# Patient Record
Sex: Female | Born: 1985 | Race: Asian | Hispanic: No | Smoking: Former smoker
Health system: Southern US, Community
[De-identification: ages and names within clinical notes are randomized; demographics above are authoritative.]

## PROBLEM LIST (undated history)

## (undated) DIAGNOSIS — C8190 Hodgkin lymphoma, unspecified, unspecified site: Secondary | ICD-10-CM

## (undated) DIAGNOSIS — Z9221 Personal history of antineoplastic chemotherapy: Secondary | ICD-10-CM

## (undated) DIAGNOSIS — K759 Inflammatory liver disease, unspecified: Secondary | ICD-10-CM

## (undated) DIAGNOSIS — K219 Gastro-esophageal reflux disease without esophagitis: Secondary | ICD-10-CM

## (undated) DIAGNOSIS — N159 Renal tubulo-interstitial disease, unspecified: Secondary | ICD-10-CM

## (undated) DIAGNOSIS — M1990 Unspecified osteoarthritis, unspecified site: Secondary | ICD-10-CM

## (undated) DIAGNOSIS — J45909 Unspecified asthma, uncomplicated: Secondary | ICD-10-CM

## (undated) DIAGNOSIS — F32A Depression, unspecified: Secondary | ICD-10-CM

## (undated) HISTORY — PX: WISDOM TOOTH EXTRACTION: SHX21

## (undated) HISTORY — PX: CHOLECYSTECTOMY: SHX55

## (undated) HISTORY — PX: BREAST SURGERY: SHX581

## (undated) HISTORY — PX: KNEE SURGERY: SHX244

---

## 2010-06-21 ENCOUNTER — Emergency Department (HOSPITAL_COMMUNITY): Admission: EM | Admit: 2010-06-21 | Discharge: 2010-06-22 | Payer: Self-pay | Admitting: Emergency Medicine

## 2010-06-23 ENCOUNTER — Emergency Department (HOSPITAL_COMMUNITY): Admission: EM | Admit: 2010-06-23 | Discharge: 2010-06-24 | Payer: Self-pay | Admitting: Emergency Medicine

## 2010-06-28 ENCOUNTER — Inpatient Hospital Stay (HOSPITAL_COMMUNITY): Admission: EM | Admit: 2010-06-28 | Discharge: 2010-06-30 | Payer: Self-pay | Admitting: Emergency Medicine

## 2010-06-28 DIAGNOSIS — F848 Other pervasive developmental disorders: Secondary | ICD-10-CM

## 2010-07-07 ENCOUNTER — Ambulatory Visit: Payer: Self-pay | Admitting: Pulmonary Disease

## 2010-07-07 ENCOUNTER — Inpatient Hospital Stay (HOSPITAL_COMMUNITY)
Admission: EM | Admit: 2010-07-07 | Discharge: 2010-07-10 | Disposition: A | Discharge status: Other Acute Inpatient Hospital | Payer: Self-pay | Source: Home / Self Care | Admitting: Emergency Medicine

## 2010-07-08 DIAGNOSIS — F331 Major depressive disorder, recurrent, moderate: Secondary | ICD-10-CM

## 2010-07-10 ENCOUNTER — Inpatient Hospital Stay (HOSPITAL_COMMUNITY)
Admission: RE | Admit: 2010-07-10 | Discharge: 2010-07-15 | Payer: Self-pay | Source: Home / Self Care | Admitting: Psychiatry

## 2010-07-10 ENCOUNTER — Ambulatory Visit: Payer: Self-pay | Admitting: Psychiatry

## 2010-07-18 ENCOUNTER — Inpatient Hospital Stay (HOSPITAL_COMMUNITY)
Admission: RE | Admit: 2010-07-18 | Discharge: 2010-07-23 | Payer: Self-pay | Source: Home / Self Care | Attending: Psychiatry | Admitting: Psychiatry

## 2010-07-23 ENCOUNTER — Emergency Department (HOSPITAL_COMMUNITY)
Admission: EM | Admit: 2010-07-23 | Discharge: 2010-07-23 | Payer: Self-pay | Source: Home / Self Care | Admitting: Emergency Medicine

## 2010-07-24 ENCOUNTER — Inpatient Hospital Stay (HOSPITAL_COMMUNITY)
Admission: AD | Admit: 2010-07-24 | Discharge: 2010-08-04 | Payer: Self-pay | Source: Home / Self Care | Attending: Psychiatry | Admitting: Psychiatry

## 2010-07-24 ENCOUNTER — Inpatient Hospital Stay (HOSPITAL_COMMUNITY): Admission: RE | Admit: 2010-07-24 | Payer: Self-pay | Source: Home / Self Care | Admitting: Psychiatry

## 2010-07-24 DIAGNOSIS — F39 Unspecified mood [affective] disorder: Secondary | ICD-10-CM

## 2010-10-21 LAB — CBC
HCT: 34 % — ABNORMAL LOW (ref 36.0–46.0)
Hemoglobin: 11.4 g/dL — ABNORMAL LOW (ref 12.0–15.0)
Hemoglobin: 13.8 g/dL (ref 12.0–15.0)
MCH: 26.9 pg (ref 26.0–34.0)
MCH: 27 pg (ref 26.0–34.0)
MCH: 27.2 pg (ref 26.0–34.0)
MCHC: 32.3 g/dL (ref 30.0–36.0)
MCHC: 33.5 g/dL (ref 30.0–36.0)
MCHC: 34 g/dL (ref 30.0–36.0)
MCHC: 32.9 g/dL (ref 30.0–36.0)
MCV: 82.3 fL (ref 78.0–100.0)
MCV: 84.5 fL (ref 78.0–100.0)
MCV: 84.1 fL (ref 78.0–100.0)
MCV: 83.1 fL (ref 78.0–100.0)
Platelets: 274 10*3/uL (ref 150–400)
Platelets: 384 10*3/uL (ref 150–400)
Platelets: 358 10*3/uL (ref 150–400)
Platelets: 204 10*3/uL (ref 150–400)
RBC: 4.04 MIL/uL (ref 3.87–5.11)
RBC: 4.09 MIL/uL (ref 3.87–5.11)
RBC: 4.99 MIL/uL (ref 3.87–5.11)
RDW: 14.8 % (ref 11.5–15.5)
RDW: 15.5 % (ref 11.5–15.5)
RDW: 15.5 % (ref 11.5–15.5)
RDW: 15.9 % — ABNORMAL HIGH (ref 11.5–15.5)
WBC: 10.6 10*3/uL — ABNORMAL HIGH (ref 4.0–10.5)
WBC: 11.2 10*3/uL — ABNORMAL HIGH (ref 4.0–10.5)
WBC: 12.4 10*3/uL — ABNORMAL HIGH (ref 4.0–10.5)
WBC: 10.8 10*3/uL — ABNORMAL HIGH (ref 4.0–10.5)
WBC: 6.9 10*3/uL (ref 4.0–10.5)
WBC: 8.5 10*3/uL (ref 4.0–10.5)

## 2010-10-21 LAB — URINALYSIS, ROUTINE W REFLEX MICROSCOPIC
Bilirubin Urine: NEGATIVE
Bilirubin Urine: NEGATIVE
Bilirubin Urine: NEGATIVE
Glucose, UA: NEGATIVE mg/dL
Hgb urine dipstick: NEGATIVE
Hgb urine dipstick: NEGATIVE
Nitrite: NEGATIVE
Protein, ur: NEGATIVE mg/dL
Protein, ur: NEGATIVE mg/dL
Protein, ur: NEGATIVE mg/dL
Specific Gravity, Urine: 1.011 (ref 1.005–1.030)
Specific Gravity, Urine: 1.015 (ref 1.005–1.030)
Urobilinogen, UA: 0.2 mg/dL (ref 0.0–1.0)
Urobilinogen, UA: 0.2 mg/dL (ref 0.0–1.0)
Urobilinogen, UA: 1 mg/dL (ref 0.0–1.0)
Urobilinogen, UA: 0.2 mg/dL (ref 0.0–1.0)

## 2010-10-21 LAB — BASIC METABOLIC PANEL
BUN: 8 mg/dL (ref 6–23)
BUN: 5 mg/dL — ABNORMAL LOW (ref 6–23)
CO2: 26 mEq/L (ref 19–32)
Calcium: 8.5 mg/dL (ref 8.4–10.5)
Chloride: 108 mEq/L (ref 96–112)
Chloride: 109 mEq/L (ref 96–112)
Creatinine, Ser: 0.67 mg/dL (ref 0.4–1.2)
Creatinine, Ser: 0.75 mg/dL (ref 0.4–1.2)
GFR calc Af Amer: >60 mL/min (ref >60)
GFR calc non Af Amer: >60 mL/min (ref >60)
Glucose, Bld: 90 mg/dL (ref 70–99)
Sodium: 143 mEq/L (ref 135–145)

## 2010-10-21 LAB — CULTURE, BLOOD (ROUTINE X 2): Culture  Setup Time: 201111282128

## 2010-10-21 LAB — COMPREHENSIVE METABOLIC PANEL
ALT: 11 U/L (ref 0–35)
ALT: 8 U/L (ref 0–35)
AST: 14 U/L (ref 0–37)
AST: 13 U/L (ref 0–37)
Albumin: 3 g/dL — ABNORMAL LOW (ref 3.5–5.2)
Albumin: 3.8 g/dL (ref 3.5–5.2)
Alkaline Phosphatase: 62 U/L (ref 39–117)
BUN: 6 mg/dL (ref 6–23)
CO2: 25 mEq/L (ref 19–32)
CO2: 23 mEq/L (ref 19–32)
Calcium: 8.8 mg/dL (ref 8.4–10.5)
Calcium: 9.2 mg/dL (ref 8.4–10.5)
Creatinine, Ser: 0.59 mg/dL (ref 0.4–1.2)
GFR calc Af Amer: >60 mL/min (ref >60)
GFR calc Af Amer: >60 mL/min (ref >60)
GFR calc non Af Amer: >60 mL/min (ref >60)
GFR calc non Af Amer: >60 mL/min (ref >60)
Potassium: 2.7 mEq/L — CL (ref 3.5–5.1)
Sodium: 141 mEq/L (ref 135–145)
Sodium: 142 mEq/L (ref 135–145)
Total Protein: 7 g/dL (ref 6.0–8.3)

## 2010-10-21 LAB — BLOOD GAS, ARTERIAL
Acid-base deficit: 2.2 mmol/L — ABNORMAL HIGH (ref 0.0–2.0)
Bicarbonate: 21.8 mEq/L (ref 20.0–24.0)
Drawn by: 257701
Drawn by: 317871
MECHVT: 420 mL
MECHVT: 420 mL
O2 Saturation: 98.2 %
PEEP: 5 cmH2O
PEEP: 5 cmH2O
Patient temperature: 98.6
RATE: 15 resp/min
RATE: 15 resp/min
TCO2: 21.2 mmol/L (ref 0–100)
pCO2 arterial: 32.7 mmHg — ABNORMAL LOW (ref 35.0–45.0)
pCO2 arterial: 40.5 mmHg (ref 35.0–45.0)
pH, Arterial: 7.363 (ref 7.350–7.400)
pH, Arterial: 7.409 — ABNORMAL HIGH (ref 7.350–7.400)
pH, Arterial: 7.453 — ABNORMAL HIGH (ref 7.350–7.400)

## 2010-10-21 LAB — DIFFERENTIAL
Basophils Relative: 1 % (ref 0–1)
Eosinophils Absolute: 0.2 10*3/uL (ref 0.0–0.7)
Eosinophils Absolute: 0.2 10*3/uL (ref 0.0–0.7)
Eosinophils Relative: 3 % (ref 0–5)
Eosinophils Relative: 1 % (ref 0–5)
Lymphocytes Relative: 12 % (ref 12–46)
Lymphs Abs: 1.3 10*3/uL (ref 0.7–4.0)
Lymphs Abs: 1.1 10*3/uL (ref 0.7–4.0)
Lymphs Abs: 0.8 10*3/uL (ref 0.7–4.0)
Monocytes Absolute: 0.5 10*3/uL (ref 0.1–1.0)
Monocytes Absolute: 0.7 10*3/uL (ref 0.1–1.0)
Monocytes Relative: 5 % (ref 3–12)
Monocytes Relative: 5 % (ref 3–12)
Monocytes Relative: 4 % (ref 3–12)
Neutro Abs: 8.4 10*3/uL — ABNORMAL HIGH (ref 1.7–7.7)

## 2010-10-21 LAB — GLUCOSE, CAPILLARY
Glucose-Capillary: 118 mg/dL — ABNORMAL HIGH (ref 70–99)
Glucose-Capillary: 71 mg/dL (ref 70–99)
Glucose-Capillary: 74 mg/dL (ref 70–99)
Glucose-Capillary: 76 mg/dL (ref 70–99)
Glucose-Capillary: 80 mg/dL (ref 70–99)
Glucose-Capillary: 88 mg/dL (ref 70–99)
Glucose-Capillary: 107 mg/dL — ABNORMAL HIGH (ref 70–99)

## 2010-10-21 LAB — RAPID URINE DRUG SCREEN, HOSP PERFORMED
Amphetamines: NOT DETECTED
Amphetamines: NOT DETECTED
Barbiturates: NOT DETECTED
Benzodiazepines: POSITIVE — AB
Cocaine: NOT DETECTED
Opiates: NOT DETECTED
Opiates: NOT DETECTED
Opiates: NOT DETECTED
Tetrahydrocannabinol: POSITIVE — AB
Tetrahydrocannabinol: POSITIVE — AB

## 2010-10-21 LAB — ACETAMINOPHEN LEVEL
Acetaminophen (Tylenol), Serum: <10 ug/mL — ABNORMAL LOW (ref 10–30)
Acetaminophen (Tylenol), Serum: <10 ug/mL — ABNORMAL LOW (ref 10–30)
Acetaminophen (Tylenol), Serum: <10 ug/mL — ABNORMAL LOW (ref 10–30)
Acetaminophen (Tylenol), Serum: <10 ug/mL — ABNORMAL LOW (ref 10–30)

## 2010-10-21 LAB — POCT I-STAT 3, ART BLOOD GAS (G3+)
Acid-Base Excess: 1 mmol/L (ref 0.0–2.0)
Bicarbonate: 25 mEq/L — ABNORMAL HIGH (ref 20.0–24.0)
O2 Saturation: 100 %
Patient temperature: 98.3
TCO2: 26 mmol/L (ref 0–100)
pH, Arterial: 7.435 — ABNORMAL HIGH (ref 7.350–7.400)

## 2010-10-21 LAB — CARDIAC PANEL(CRET KIN+CKTOT+MB+TROPI)
Relative Index: INVALID (ref 0.0–2.5)
Total CK: 21 U/L (ref 7–177)
Total CK: 25 U/L (ref 7–177)

## 2010-10-21 LAB — MRSA PCR SCREENING: MRSA by PCR: NEGATIVE

## 2010-10-21 LAB — APTT: aPTT: 43 seconds — ABNORMAL HIGH (ref 24–37)

## 2010-10-21 LAB — POCT I-STAT, CHEM 8
BUN: 10 mg/dL (ref 6–23)
Calcium, Ion: 1.11 mmol/L — ABNORMAL LOW (ref 1.12–1.32)
Chloride: 106 mEq/L (ref 96–112)
Glucose, Bld: 90 mg/dL (ref 70–99)

## 2010-10-21 LAB — TSH: TSH: 0.604 u[IU]/mL (ref 0.350–4.500)

## 2010-10-21 LAB — URINE MICROSCOPIC-ADD ON

## 2010-10-21 LAB — ETHANOL
Alcohol, Ethyl (B): <5 mg/dL (ref 0–10)
Alcohol, Ethyl (B): <5 mg/dL (ref 0–10)

## 2010-10-21 LAB — PROTIME-INR: Prothrombin Time: 15 seconds (ref 11.6–15.2)

## 2010-10-21 LAB — POCT CARDIAC MARKERS: Troponin i, poc: <0.05 ng/mL (ref 0.00–0.09)

## 2010-10-21 LAB — POCT PREGNANCY, URINE: Preg Test, Ur: NEGATIVE

## 2010-10-21 LAB — LACTIC ACID, PLASMA: Lactic Acid, Venous: 1.1 mmol/L (ref 0.5–2.2)

## 2011-02-03 ENCOUNTER — Ambulatory Visit (HOSPITAL_COMMUNITY): Payer: Self-pay | Admitting: Psychiatry

## 2011-07-27 IMAGING — CR DG CHEST 2V
2 series · 2 of 2 positions shown · non-contrast
Comparison: 07/08/2010

CLINICAL DATA: Short of breath

CHEST - 2 VIEW

[w chest pa]
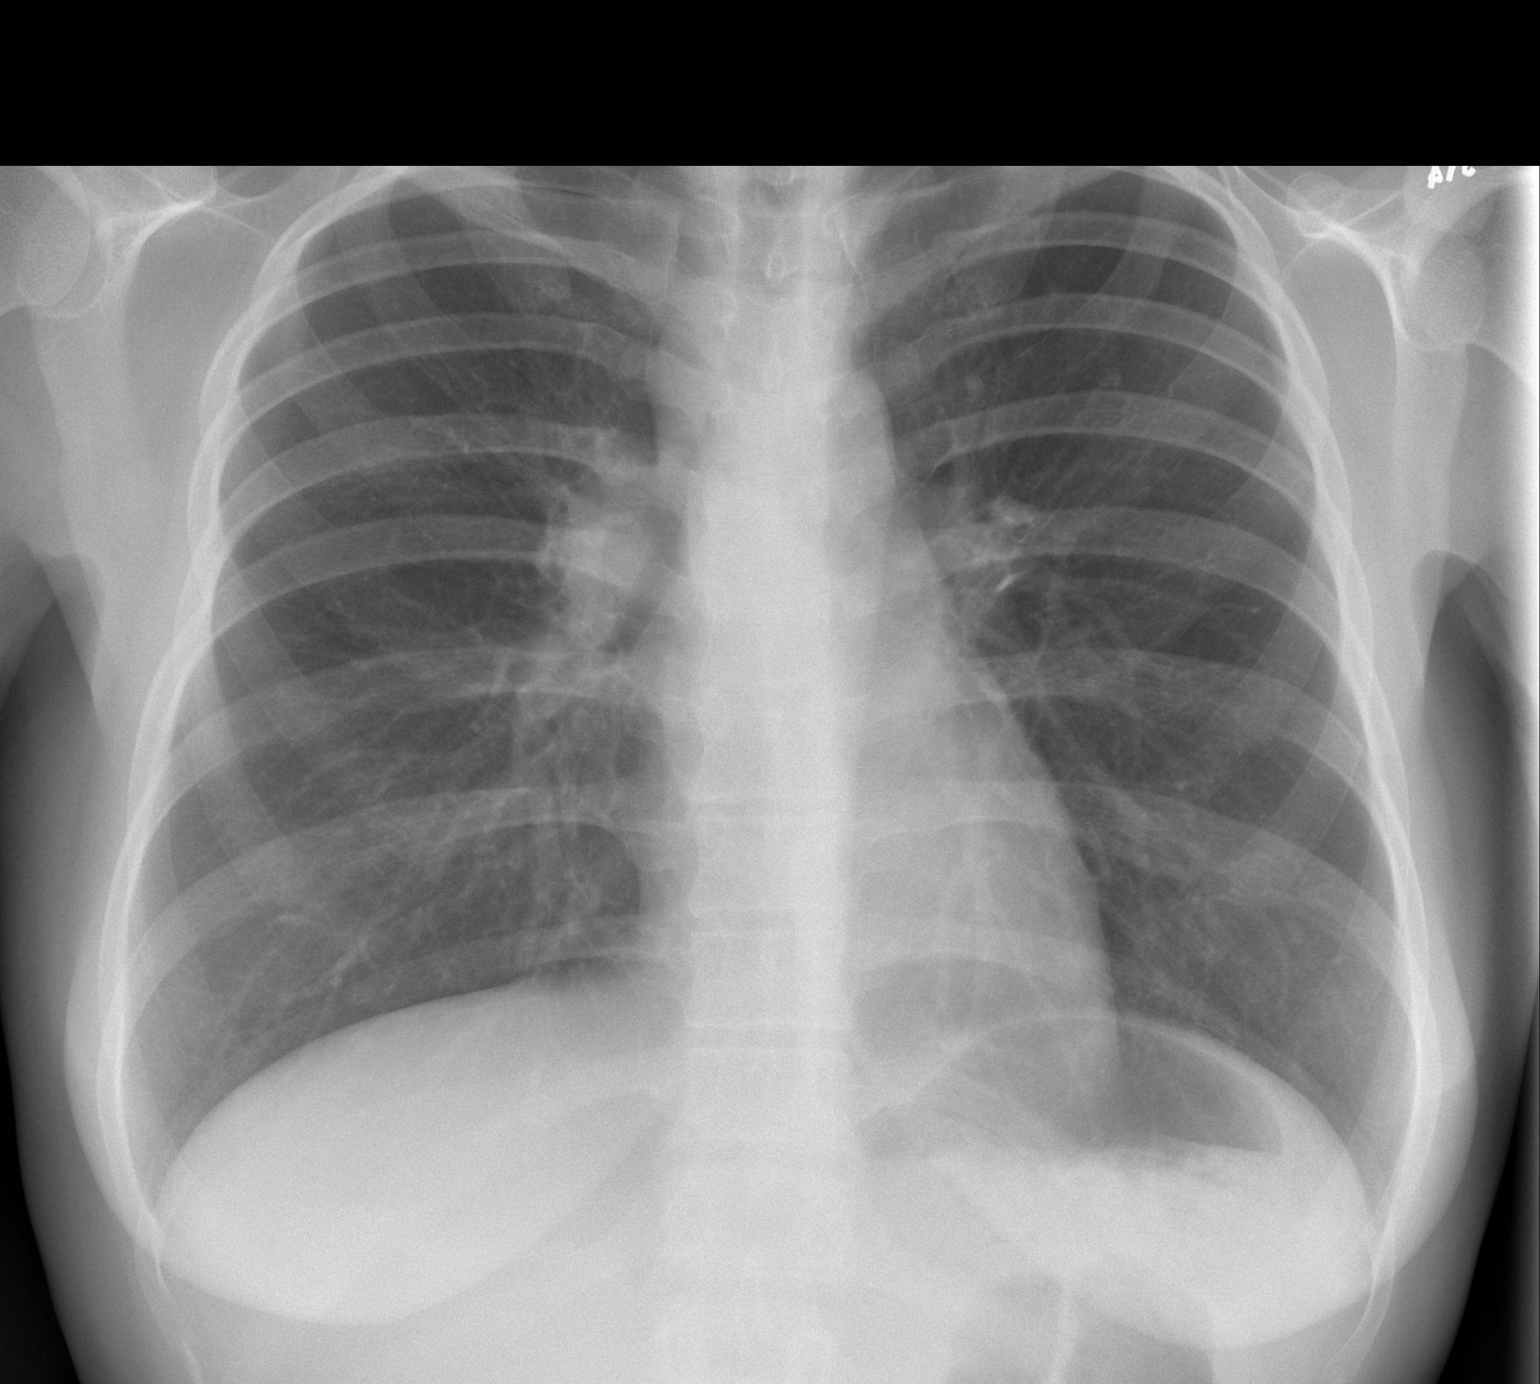

[w chest lat]
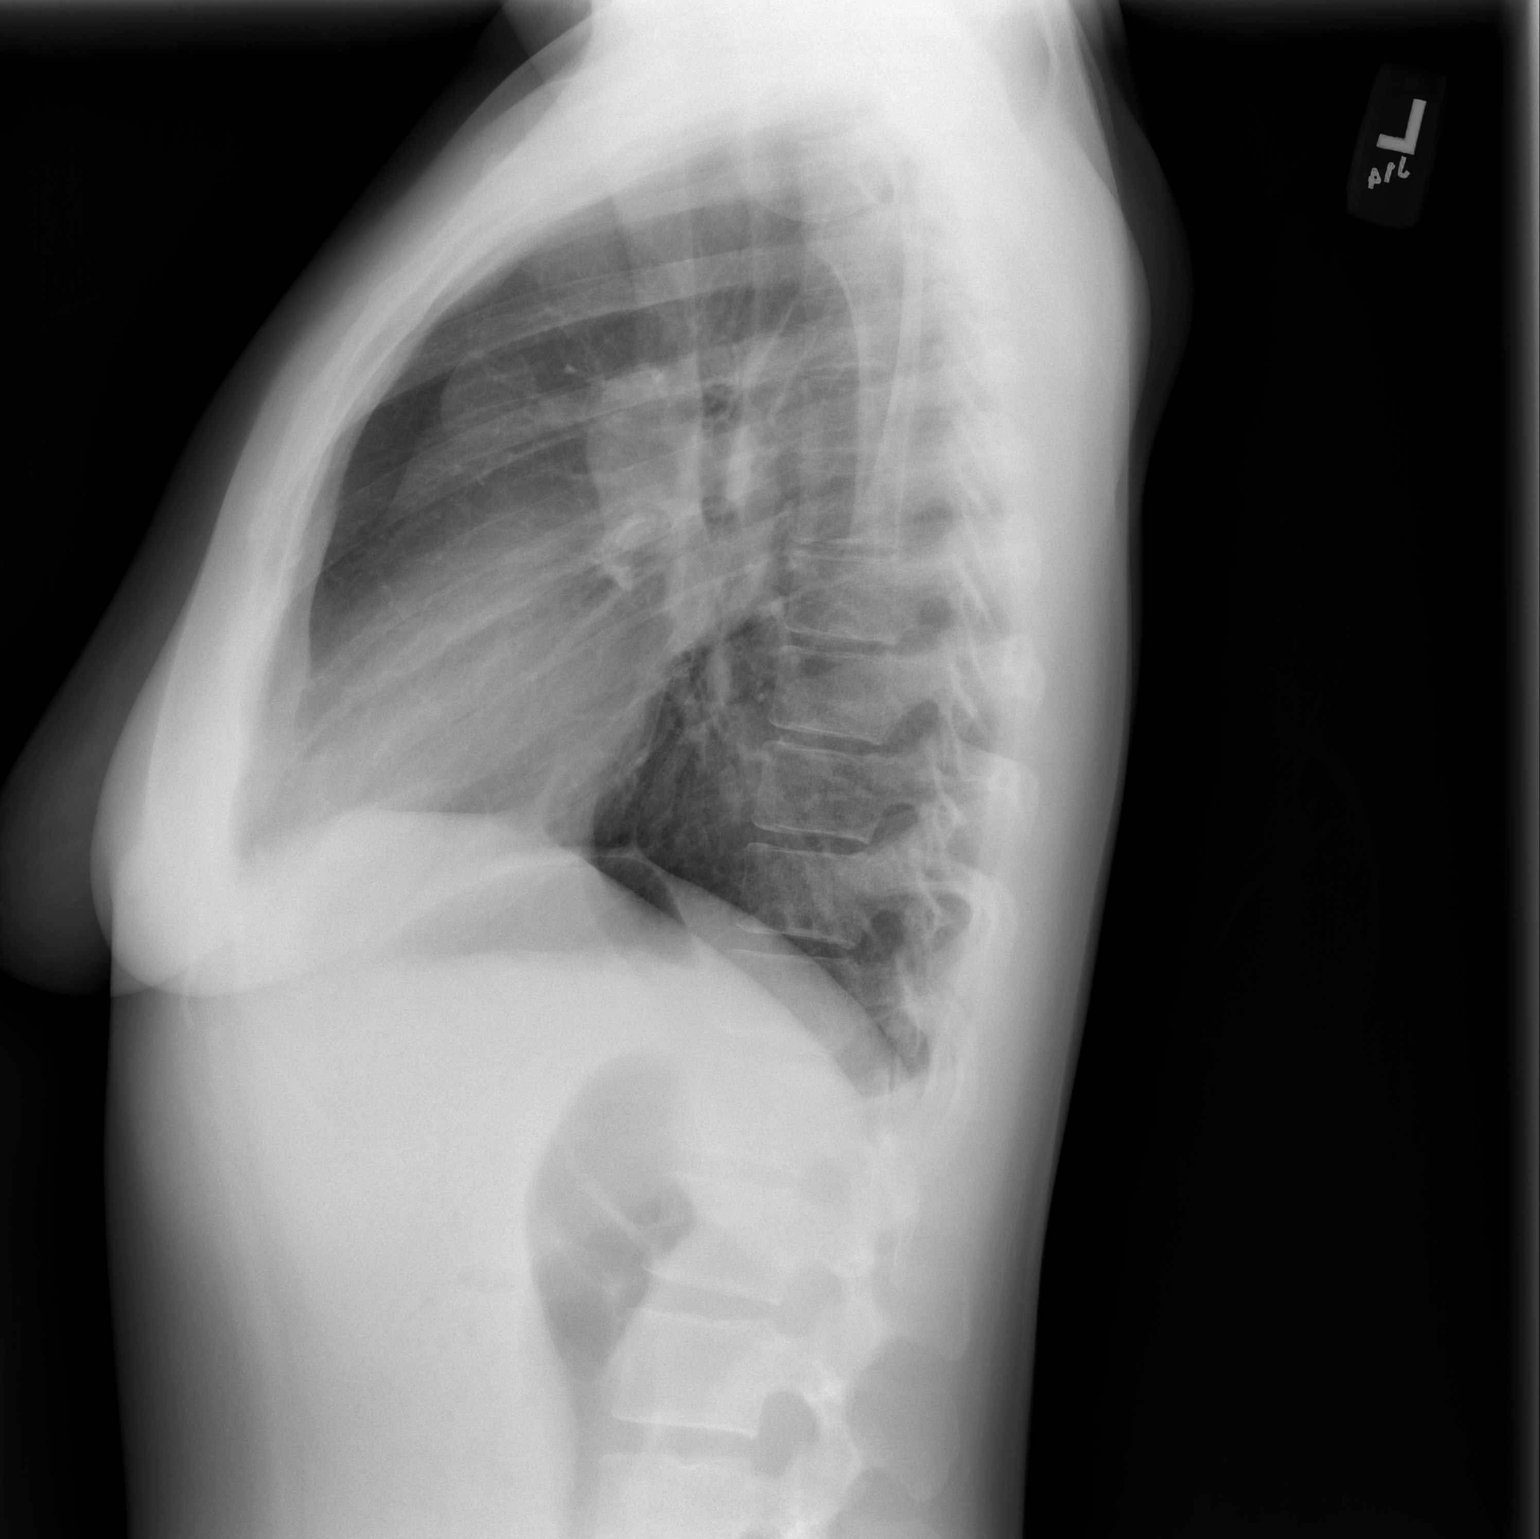

[2 of 2 positions shown; findings below may reference images not displayed]

FINDINGS: There is soft tissue prominence in the right paratracheal
and right hilar regions.  Bronchitic changes.  A 2 cm ovoid density
projects  over the retrosternal space.  No pneumothorax.  No
pleural effusion.  Normal heart size.
IMPRESSION: Findings worrisome for mass effect or adenopathy in the right hilum
and right paratracheal regions.  Comparison with prior films would
be helpful.  None are available, CT is warranted.

2 cm density projects over the retrosternal space.  Lung mass is
not excluded.

## 2016-11-02 DIAGNOSIS — Z8571 Personal history of Hodgkin lymphoma: Secondary | ICD-10-CM | POA: Diagnosis not present

## 2017-06-14 ENCOUNTER — Ambulatory Visit (HOSPITAL_COMMUNITY)
Admission: EM | Admit: 2017-06-14 | Discharge: 2017-06-14 | Disposition: A | Discharge status: Home / Self Care | Payer: Medicaid Other | Attending: Family Medicine | Admitting: Family Medicine

## 2017-06-14 ENCOUNTER — Encounter (HOSPITAL_COMMUNITY): Payer: Self-pay | Admitting: Emergency Medicine

## 2017-06-14 DIAGNOSIS — J0100 Acute maxillary sinusitis, unspecified: Secondary | ICD-10-CM

## 2017-06-14 DIAGNOSIS — K0889 Other specified disorders of teeth and supporting structures: Secondary | ICD-10-CM

## 2017-06-14 HISTORY — DX: Hodgkin lymphoma, unspecified, unspecified site: C81.90

## 2017-06-14 MED ORDER — AMOXICILLIN-POT CLAVULANATE 875-125 MG PO TABS: 1.0000 | ORAL_TABLET | Freq: Two times a day (BID) | ORAL | 0 refills | Status: DC

## 2017-06-14 MED ORDER — FLUTICASONE PROPIONATE 50 MCG/ACT NA SUSP: 2.0000 | Freq: Every day | NASAL | 0 refills | Status: DC

## 2017-06-14 MED ORDER — CETIRIZINE-PSEUDOEPHEDRINE ER 5-120 MG PO TB12
1.0000 | ORAL_TABLET | Freq: Every day | ORAL | 0 refills | Status: DC
Start: 2017-06-14 — End: 2020-07-12

## 2017-06-14 NOTE — Discharge Instructions (Signed)
Augmentin for sinus infection and possible dental abscess. Start flonase, zyrtec-D for nasal congestion. You can use over the counter nasal saline rinse such as neti pot for nasal congestion. Keep hydrated, your urine should be clear to pale yellow in color. Tylenol/motrin for fever and pain. Monitor for any worsening of symptoms, chest pain, shortness of breath, wheezing, swelling of the throat, follow up for reevaluation.   Please follow up with your dentist for further evaluation needed.

## 2017-06-14 NOTE — ED Provider Notes (Signed)
[facility]    CSN: [id number] Arrival date & time: 06/14/17  1013     History   Chief Complaint Chief Complaint  Patient presents with  . Nasal Congestion    HPI Salvador Valerie Johnson is a 31 y.o. female.   31 year old female comes in with 3 week history of nasal congestion. Productive cough first thing in the morning. Denies chest pain, shortness of breath, wheezing. Denies fevers, chills, night sweats. Denies sore throat. Has had some ear popping, denies pain, drainage. Noticed swollen lymph node on the left side of neck today. Current every day smoker, 1ppd, 16 pack year history. States she had a filling fixed 2 weeks ago, noticed some left lower tooth sensitivity this morning, and thinks there may be a filling that is missing. She has been taking ibuprofen and zyrtec without relief.        Past Medical History:  Diagnosis Date  . Hodgkin lymphoma ([place])     There are no active problems to display for this patient.   Past Surgical History:  Procedure Laterality Date  . CESAREAN SECTION    . KNEE SURGERY    . WISDOM TOOTH EXTRACTION      OB History    No data available       Home Medications    Prior to Admission medications   Medication Sig Start Date End Date Taking? Authorizing Provider  CETIRIZINE HCL PO Take by mouth.   Yes [provider]  omeprazole (PRILOSEC) 40 MG capsule Take 40 mg daily by mouth.   Yes [provider]  traZODone (DESYREL) 50 MG tablet Take 50 mg at bedtime by mouth.   Yes [provider]  amoxicillin-clavulanate (AUGMENTIN) 875-125 MG tablet Take 1 tablet every 12 (twelve) hours by mouth. 06/14/17   Tasia Catchings, Adrion Menz V, PA-C  cetirizine-pseudoephedrine (ZYRTEC-D) 5-120 MG tablet Take 1 tablet daily by mouth. 06/14/17   Tasia Catchings, Darina Hartwell V, PA-C  fluticasone (FLONASE) 50 MCG/ACT nasal spray Place 2 sprays daily into both nostrils. 06/14/17   Ok Edwards, PA-C    Family History No family history on file.  Social  History Social History   Tobacco Use  . Smoking status: Not on file  Substance Use Topics  . Alcohol use: Not on file  . Drug use: Not on file     Allergies   Patient has no known allergies.   Review of Systems Review of Systems  Reason unable to perform ROS: See HPI as above.     Physical Exam Triage Vital Signs ED Triage Vitals [06/14/17 1125]  Enc Vitals Group     BP 122/76     Pulse Rate 61     Resp 18     Temp 97.9 F (36.6 C)     Temp Source Oral     SpO2 100 %     Weight      Height      Head Circumference      Peak Flow      Pain Score 0     Pain Loc      Pain Edu?      Excl. in [place]?    No data found.  Updated Vital Signs BP 122/76   Pulse 61   Temp 97.9 F (36.6 C) (Oral)   Resp 18   SpO2 100%   Physical Exam  Constitutional: She is oriented to person, place, and time. She appears well-developed and well-nourished. No distress.  HENT:  Head: Normocephalic and atraumatic.  Right Ear: External ear and ear canal normal. Tympanic membrane is erythematous. Tympanic membrane is not bulging.  Left Ear: External ear and ear canal normal. Tympanic membrane is erythematous. Tympanic membrane is not bulging.  Nose: Right sinus exhibits maxillary sinus tenderness. Right sinus exhibits no frontal sinus tenderness. Left sinus exhibits maxillary sinus tenderness. Left sinus exhibits no frontal sinus tenderness.  Mouth/Throat: Uvula is midline, oropharynx is clear and moist and mucous membranes are normal.    Tenderness on palpation of left lower gum. No obvious swelling felt.   Eyes: Conjunctivae are normal. Pupils are equal, round, and reactive to light.  Neck: Normal range of motion. Neck supple.  Cardiovascular: Normal rate, regular rhythm and normal heart sounds. Exam reveals no gallop and no friction rub.  No murmur heard. Pulmonary/Chest: Effort normal and breath sounds normal. She has no decreased breath sounds. She has no wheezes. She has no rhonchi.  She has no rales.  Lymphadenopathy:    She has no cervical adenopathy.  Neurological: She is alert and oriented to person, place, and time.  Skin: Skin is warm and dry.  Psychiatric: She has a normal mood and affect. Her behavior is normal. Judgment normal.     UC Treatments / Results  Labs (all labs ordered are listed, but only abnormal results are displayed) Labs Reviewed - No data to display  EKG  EKG Interpretation None       Radiology No results found.  Procedures Procedures (including critical care time)  Medications Ordered in UC Medications - No data to display   Initial Impression / Assessment and Plan / UC Course  I have reviewed the triage vital signs and the nursing notes.  Pertinent labs & imaging results that were available during my care of the patient were reviewed by me and considered in my medical decision making (see chart for details).    Start augmentin as directed for sinusitis and possible dental abscess. Other symptomatic treatment provided. Patient to follow up with dentist for further evaluation and dental pain. Return precautions given.   Final Clinical Impressions(s) / UC Diagnoses   Final diagnoses:  Acute non-recurrent maxillary sinusitis  Pain, dental    New Prescriptions This SmartLink is deprecated. Use AVSMEDLIST instead to display the medication list for a patient.    Ok Edwards, PA-C 06/14/17 1302

## 2017-06-14 NOTE — ED Triage Notes (Signed)
Pt c/o swollen lymph node on L side of neck with nasal congestion.

## 2017-06-30 ENCOUNTER — Encounter (HOSPITAL_COMMUNITY): Payer: Self-pay | Admitting: Emergency Medicine

## 2017-06-30 ENCOUNTER — Ambulatory Visit (HOSPITAL_COMMUNITY)
Admission: EM | Admit: 2017-06-30 | Discharge: 2017-06-30 | Disposition: A | Discharge status: Home / Self Care | Payer: Medicaid Other | Attending: Family Medicine | Admitting: Family Medicine

## 2017-06-30 DIAGNOSIS — N3001 Acute cystitis with hematuria: Secondary | ICD-10-CM | POA: Diagnosis present

## 2017-06-30 DIAGNOSIS — Z8571 Personal history of Hodgkin lymphoma: Secondary | ICD-10-CM | POA: Diagnosis not present

## 2017-06-30 LAB — POCT URINALYSIS DIP (DEVICE)
GLUCOSE, UA: 250 mg/dL — AB
KETONES UR: 15 mg/dL — AB
Nitrite: POSITIVE — AB
PROTEIN: 100 mg/dL — AB
SPECIFIC GRAVITY, URINE: 1.015 (ref 1.005–1.030)
Urobilinogen, UA: 4 mg/dL — ABNORMAL HIGH (ref 0.0–1.0)
pH: 5.5 (ref 5.0–8.0)

## 2017-06-30 MED ORDER — SULFAMETHOXAZOLE-TRIMETHOPRIM 800-160 MG PO TABS: 1.0000 | ORAL_TABLET | Freq: Two times a day (BID) | ORAL | 0 refills | Status: AC

## 2017-06-30 NOTE — ED Provider Notes (Signed)
  [facility]    ASSESSMENT & PLAN:  1. Acute cystitis with hematuria     Meds ordered this encounter  Medications  . sulfamethoxazole-trimethoprim (BACTRIM DS,SEPTRA DS) 800-160 MG tablet    Sig: Take 1 tablet by mouth 2 (two) times daily for 5 days.    Dispense:  10 tablet    Refill:  0    Urine culture sent. Will notify patient when results available. Will follow up with her PCP or here if not showing improvement over the next 48 hours, sooner if needed.  Outlined signs and symptoms indicating need for more acute intervention. Patient verbalized understanding. After Visit Summary given.  SUBJECTIVE:  Valerie Johnson is a 31 y.o. female who complains of urinary frequency, urgency and dysuria for the past few days. No flank pain, fever, chills, abnormal vaginal discharge or bleeding. Hematuria: present.  Normal PO intake. No flank or abdominal pain. No self treatment.  LMP: No LMP recorded. Patient is not currently having periods (Reason: IUD).  ROS: As in HPI.  OBJECTIVE:  Vitals:   06/30/17 1130  BP: 126/82  Pulse: 73  Resp: 18  Temp: 98.6 F (37 C)  TempSrc: Oral  SpO2: 98%   Appears well, in no apparent distress. Abdomen is soft without tenderness, guarding, mass, rebound or organomegaly. No CVA tenderness or inguinal adenopathy noted.  Labs Reviewed  POCT URINALYSIS DIP (DEVICE) - Abnormal; Notable for the following components:      Result Value   Glucose, UA 250 (*)    Bilirubin Urine MODERATE (*)    Ketones, ur 15 (*)    Hgb urine dipstick TRACE (*)    Protein, ur 100 (*)    Urobilinogen, UA 4.0 (*)    Nitrite POSITIVE (*)    Leukocytes, UA LARGE (*)    All other components within normal limits  URINE CULTURE    No Known Allergies  Past Medical History:  Diagnosis Date  . Hodgkin lymphoma [facility])    Social History   Socioeconomic History  . Marital status: Legally Separated    Spouse name: Not on file  . Number of children: Not on  file  . Years of education: Not on file  . Highest education level: Not on file  Social Needs  . Financial resource strain: Not on file  . Food insecurity - worry: Not on file  . Food insecurity - inability: Not on file  . Transportation needs - medical: Not on file  . Transportation needs - non-medical: Not on file  Occupational History  . Not on file  Tobacco Use  . Smoking status: Not on file  Substance and Sexual Activity  . Alcohol use: Not on file  . Drug use: Not on file  . Sexual activity: Not on file  Other Topics Concern  . Not on file  Social History Narrative  . Not on file       Vanessa Kick, MD 06/30/17 1208

## 2017-06-30 NOTE — ED Triage Notes (Signed)
Pt here for UTI sx and is taking azo; pt sts nasal congestion and cough also

## 2017-07-02 LAB — URINE CULTURE

## 2019-02-08 DIAGNOSIS — C8118 Nodular sclerosis classical Hodgkin lymphoma, lymph nodes of multiple sites: Secondary | ICD-10-CM

## 2019-02-17 DIAGNOSIS — C8118 Nodular sclerosis classical Hodgkin lymphoma, lymph nodes of multiple sites: Secondary | ICD-10-CM

## 2020-05-23 ENCOUNTER — Other Ambulatory Visit (HOSPITAL_BASED_OUTPATIENT_CLINIC_OR_DEPARTMENT_OTHER): Payer: Self-pay | Admitting: Orthopedic Surgery

## 2020-05-23 DIAGNOSIS — M25561 Pain in right knee: Secondary | ICD-10-CM

## 2020-05-25 ENCOUNTER — Ambulatory Visit (HOSPITAL_BASED_OUTPATIENT_CLINIC_OR_DEPARTMENT_OTHER)
Admission: RE | Admit: 2020-05-25 | Discharge: 2020-05-25 | Disposition: A | Discharge status: Home / Self Care | Payer: BC Managed Care – PPO | Source: Ambulatory Visit | Attending: Orthopedic Surgery | Admitting: Orthopedic Surgery

## 2020-05-25 ENCOUNTER — Other Ambulatory Visit: Payer: Self-pay

## 2020-05-25 DIAGNOSIS — M25561 Pain in right knee: Secondary | ICD-10-CM | POA: Diagnosis not present

## 2020-06-17 NOTE — Patient Instructions (Addendum)
DUE TO COVID-19 ONLY ONE VISITOR IS ALLOWED TO COME WITH YOU AND STAY IN THE WAITING ROOM ONLY DURING PRE OP AND PROCEDURE DAY OF SURGERY. THE 1 VISITOR  MAY VISIT WITH YOU AFTER SURGERY IN YOUR PRIVATE ROOM DURING VISITING HOURS ONLY!  YOU NEED TO HAVE A COVID 19 TEST ON Thursday   06/20/2020 @_______ , THIS TEST MUST BE DONE BEFORE SURGERY,  COVID TESTING SITE [place] [address], IT IS ON THE RIGHT GOING OUT [address] APPROXIMATELY  2 MINUTES PAST [employer] ON THE RIGHT. ONCE YOUR COVID TEST IS COMPLETED,  PLEASE BEGIN THE QUARANTINE INSTRUCTIONS AS OUTLINED IN YOUR HANDOUT.                Valerie Johnson Finger  06/17/2020   Your procedure is scheduled on: Monday 06/24/2020   Report to [facility] Main  Entrance   Report to admitting at [place] AM     Call this number if you have problems the morning of surgery [phone]    Remember: Do not eat food After Midnight. May have clear liquids from midnight till 7:00 am day of surgery then nothing by mouth.   BRUSH YOUR TEETH MORNING OF SURGERY AND RINSE YOUR MOUTH OUT, NO CHEWING GUM CANDY OR MINTS.     Take these medicines the morning of surgery: Aripiprazole (Abilify); Cetirizine (Zyrtec); Omeprazole (Prilosec)                               You may not have any metal on your body including hair pins and              piercings  Do not wear jewelry, make-up, lotions, powders or perfumes, deodorant             Do not wear nail polish on your fingernails.  Do not shave  48 hours prior to surgery.     Do not bring valuables to the hospital. [place].  Contacts, dentures or bridgework may not be worn into surgery.      Patients discharged the day of surgery will not be allowed to drive home. IF YOU ARE HAVING SURGERY AND GOING HOME THE SAME DAY, YOU MUST HAVE AN ADULT TO DRIVE YOU HOME AND BE WITH YOU FOR 24 HOURS. YOU MAY GO HOME BY TAXI OR UBER  OR ORTHERWISE, BUT AN ADULT MUST ACCOMPANY YOU HOME AND STAY WITH YOU FOR 24 HOURS.  _____________________________________________________________________                CLEAR LIQUID DIET   Foods Allowed                                                                      Foods Excluded  Black Coffee and tea, regular and decaf                                            liquids that you  cannot  Plain Jell-O any favor except red or purple                                           see through such as: Fruit ices (not with fruit pulp)                                               milk, soups, orange juice  Iced Popsicles                                                      All solid food Carbonated beverages, regular and diet                                    Cranberry, grape and apple juices Sports drinks like Gatorade Lightly seasoned clear broth or consume(fat free) Sugar, honey syrup  _____________________________________________________________________  [facility] - Preparing for Surgery Before surgery, you can play an important role.  Because skin is not sterile, your skin needs to be as free of germs as possible.  You can reduce the number of germs on your skin by washing with CHG (chlorahexidine gluconate) soap before surgery.  CHG is an antiseptic cleaner which kills germs and bonds with the skin to continue killing germs even after washing. Please DO NOT use if you have an allergy to CHG or antibacterial soaps.  If your skin becomes reddened/irritated stop using the CHG and inform your nurse when you arrive at Short Stay. Do not shave (including legs and underarms) for at least 48 hours prior to the first CHG shower.  You may shave your face/neck. Please follow these instructions carefully:  1.  Shower with CHG Soap the night before surgery and the  morning of Surgery.  2.  If you choose to wash your hair, wash your hair first as usual with your  normal  shampoo.  3.  After  you shampoo, rinse your hair and body thoroughly to remove the  shampoo.                           4.  Use CHG as you would any other liquid soap.  You can apply chg directly  to the skin and wash                       Gently with a scrungie or clean washcloth.  5.  Apply the CHG Soap to your body ONLY FROM THE NECK DOWN.   Do not use on face/ open                           Wound or open sores. Avoid contact with eyes, ears mouth and genitals (private parts).                       Wash face,  Genitals (private parts) with your normal soap.  6.  Wash thoroughly, paying special attention to the area where your surgery  will be performed.  7.  Thoroughly rinse your body with warm water from the neck down.  8.  DO NOT shower/wash with your normal soap after using and rinsing off  the CHG Soap.                9.  Pat yourself dry with a clean towel.            10.  Wear clean pajamas.            11.  Place clean sheets on your bed the night of your first shower and do not  sleep with pets. Day of Surgery : Do not apply any lotions/deodorants the morning of surgery.  Please wear clean clothes to the hospital/surgery center.  FAILURE TO FOLLOW THESE INSTRUCTIONS MAY RESULT IN THE CANCELLATION OF YOUR SURGERY PATIENT SIGNATURE_________________________________  NURSE SIGNATURE__________________________________  ________________________________________________________________________

## 2020-06-17 NOTE — Progress Notes (Signed)
Please place surgical orders in epic for surgery procedure scheduled on 06/24/2020. Patient has PAT appt scheduled for 06/19/2020. Thanks.

## 2020-06-18 ENCOUNTER — Other Ambulatory Visit: Payer: Self-pay | Admitting: Orthopedic Surgery

## 2020-06-19 ENCOUNTER — Encounter (HOSPITAL_COMMUNITY): Payer: Self-pay

## 2020-06-19 ENCOUNTER — Encounter (HOSPITAL_COMMUNITY)
Admission: RE | Admit: 2020-06-19 | Discharge: 2020-06-19 | Disposition: A | Discharge status: Home / Self Care | Payer: BC Managed Care – PPO | Source: Ambulatory Visit | Attending: Orthopedic Surgery | Admitting: Orthopedic Surgery

## 2020-06-19 ENCOUNTER — Other Ambulatory Visit: Payer: Self-pay

## 2020-06-19 DIAGNOSIS — Z01812 Encounter for preprocedural laboratory examination: Secondary | ICD-10-CM | POA: Insufficient documentation

## 2020-06-19 HISTORY — DX: Depression, unspecified: F32.A

## 2020-06-19 HISTORY — DX: Renal tubulo-interstitial disease, unspecified: N15.9

## 2020-06-19 HISTORY — DX: Unspecified asthma, uncomplicated: J45.909

## 2020-06-19 HISTORY — DX: Unspecified osteoarthritis, unspecified site: M19.90

## 2020-06-19 HISTORY — DX: Inflammatory liver disease, unspecified: K75.9

## 2020-06-19 LAB — COMPREHENSIVE METABOLIC PANEL
ALT: 13 U/L (ref 0–44)
AST: 15 U/L (ref 15–41)
Albumin: 4.2 g/dL (ref 3.5–5.0)
Alkaline Phosphatase: 32 U/L — ABNORMAL LOW (ref 38–126)
Anion gap: 8 (ref 5–15)
BUN: 8 mg/dL (ref 6–20)
CO2: 24 mmol/L (ref 22–32)
Calcium: 9 mg/dL (ref 8.9–10.3)
Chloride: 106 mmol/L (ref 98–111)
Creatinine, Ser: 0.71 mg/dL (ref 0.44–1.00)
GFR, Estimated: >60 mL/min (ref >60)
Glucose, Bld: 84 mg/dL (ref 70–99)
Potassium: 4.6 mmol/L (ref 3.5–5.1)
Sodium: 138 mmol/L (ref 135–145)
Total Bilirubin: 0.8 mg/dL (ref 0.3–1.2)
Total Protein: 7.3 g/dL (ref 6.5–8.1)

## 2020-06-19 LAB — CBC WITH DIFFERENTIAL/PLATELET
Abs Immature Granulocytes: 0.01 10*3/uL (ref 0.00–0.07)
Basophils Absolute: 0.1 10*3/uL (ref 0.0–0.1)
Basophils Relative: 1 %
Eosinophils Absolute: 0.2 10*3/uL (ref 0.0–0.5)
Eosinophils Relative: 3 %
HCT: 38.3 % (ref 36.0–46.0)
Hemoglobin: 12.9 g/dL (ref 12.0–15.0)
Immature Granulocytes: 0 %
Lymphocytes Relative: 22 %
Lymphs Abs: 1.1 10*3/uL (ref 0.7–4.0)
MCH: 30.8 pg (ref 26.0–34.0)
MCHC: 33.7 g/dL (ref 30.0–36.0)
MCV: 91.4 fL (ref 80.0–100.0)
Monocytes Absolute: 0.3 10*3/uL (ref 0.1–1.0)
Monocytes Relative: 6 %
Neutro Abs: 3.5 10*3/uL (ref 1.7–7.7)
Neutrophils Relative %: 68 %
Platelets: 261 10*3/uL (ref 150–400)
RBC: 4.19 MIL/uL (ref 3.87–5.11)
RDW: 12.2 % (ref 11.5–15.5)
WBC: 5.3 10*3/uL (ref 4.0–10.5)
nRBC: 0 % (ref 0.0–0.2)

## 2020-06-19 NOTE — Progress Notes (Addendum)
PCP - Nodal, Reinaldo, PA-C Cardiologist - no  PPM/ICD -  Device Orders -  Rep Notified -   Chest x-ray -  EKG -  Stress Test -  ECHO -  Cardiac Cath -   Sleep Study -  CPAP -   Fasting Blood Sugar -  Checks Blood Sugar _____ times a day  Blood Thinner Instructions: Aspirin Instructions:  ERAS Protcol - PRE-SURGERY Ensure or G2-   COVID TEST- 11-11 ACTIVITY-  Can walk a flight of stairs without sob  Anesthesia review: Hx of Hep C went for treatment and it was no longer detected no treatment necessary, . Hodgkins lymphoma  Patient denies shortness of breath, fever, cough and chest pain at PAT appointment  none   All instructions explained to the patient, with a verbal understanding of the material. Patient agrees to go over the instructions while at home for a better understanding. Patient also instructed to self quarantine after being tested for COVID-19. The opportunity to ask questions was provided.

## 2020-06-20 ENCOUNTER — Other Ambulatory Visit (HOSPITAL_COMMUNITY)
Admission: RE | Admit: 2020-06-20 | Discharge: 2020-06-20 | Disposition: A | Discharge status: Home / Self Care | Payer: BC Managed Care – PPO | Source: Ambulatory Visit | Attending: Orthopedic Surgery | Admitting: Orthopedic Surgery

## 2020-06-20 DIAGNOSIS — Z20822 Contact with and (suspected) exposure to covid-19: Secondary | ICD-10-CM | POA: Diagnosis not present

## 2020-06-20 DIAGNOSIS — Z01812 Encounter for preprocedural laboratory examination: Secondary | ICD-10-CM | POA: Diagnosis not present

## 2020-06-20 LAB — SARS CORONAVIRUS 2 (TAT 6-24 HRS): SARS Coronavirus 2: NEGATIVE

## 2020-06-24 ENCOUNTER — Encounter (HOSPITAL_COMMUNITY): Admission: RE | Payer: Self-pay | Source: Home / Self Care

## 2020-06-24 ENCOUNTER — Ambulatory Visit (HOSPITAL_COMMUNITY): Admission: RE | Admit: 2020-06-24 | Payer: Medicaid Other | Source: Home / Self Care | Admitting: Orthopedic Surgery

## 2020-06-24 SURGERY — ARTHROSCOPY, KNEE, WITH PLICA EXCISION
Anesthesia: Choice | Site: Knee | Laterality: Right

## 2020-06-24 SURGERY — Surgical Case
Anesthesia: *Unknown

## 2020-07-12 ENCOUNTER — Encounter (HOSPITAL_COMMUNITY): Payer: Self-pay | Admitting: Orthopedic Surgery

## 2020-07-12 ENCOUNTER — Other Ambulatory Visit: Payer: Self-pay

## 2020-07-12 ENCOUNTER — Other Ambulatory Visit
Admission: RE | Admit: 2020-07-12 | Discharge: 2020-07-12 | Disposition: A | Discharge status: Home / Self Care | Payer: BC Managed Care – PPO | Source: Ambulatory Visit | Attending: Orthopedic Surgery | Admitting: Orthopedic Surgery

## 2020-07-12 ENCOUNTER — Other Ambulatory Visit: Payer: Self-pay | Admitting: Orthopedic Surgery

## 2020-07-12 DIAGNOSIS — Z20822 Contact with and (suspected) exposure to covid-19: Secondary | ICD-10-CM | POA: Diagnosis not present

## 2020-07-12 DIAGNOSIS — Z01812 Encounter for preprocedural laboratory examination: Secondary | ICD-10-CM | POA: Insufficient documentation

## 2020-07-12 NOTE — Progress Notes (Signed)
COVID Vaccine Completed: Yes Date COVID Vaccine completed: 10/06/19, 11/02/19,04/04/20 COVID vaccine manufacturer:  Moderna     PCP - Maggie Schwalbe, PA-C Cardiologist - N/A  Chest x-ray - N/A EKG - N/A Stress Test - N/A ECHO - N/A Cardiac Cath - N/A Pacemaker/ICD device last checked:N/A  Sleep Study - N/A CPAP - N/A  Fasting Blood Sugar - N/A Checks Blood Sugar __N/A___ times a day  Blood Thinner Instructions: N/A Aspirin Instructions: N/A Last Dose: N/A  Activity level:  Able to exercise without symptoms     Anesthesia review: N/A  Patient denies shortness of breath, fever, cough and chest pain at PAT appointment   Patient verbalized understanding of instructions that were given to them at the PAT appointment. Patient was also instructed that they will need to review over the PAT instructions again at home before surgery.

## 2020-07-12 NOTE — Progress Notes (Addendum)
Instructed patient to arrive at 0855 morning of surgery (12/6). Valerie Johnson verbalized understanding.

## 2020-07-14 LAB — SARS CORONAVIRUS 2 (TAT 6-24 HRS): SARS Coronavirus 2: NEGATIVE

## 2020-07-15 ENCOUNTER — Ambulatory Visit (HOSPITAL_COMMUNITY): Payer: BC Managed Care – PPO | Admitting: Certified Registered Nurse Anesthetist

## 2020-07-15 ENCOUNTER — Encounter (HOSPITAL_COMMUNITY)
Admission: RE | Disposition: A | Discharge status: Home / Self Care | Payer: Self-pay | Source: Home / Self Care | Attending: Orthopedic Surgery

## 2020-07-15 ENCOUNTER — Ambulatory Visit (HOSPITAL_COMMUNITY)
Admission: RE | Admit: 2020-07-15 | Discharge: 2020-07-15 | Disposition: A | Discharge status: Home / Self Care | Payer: BC Managed Care – PPO | Attending: Orthopedic Surgery | Admitting: Orthopedic Surgery

## 2020-07-15 ENCOUNTER — Encounter (HOSPITAL_COMMUNITY): Payer: Self-pay | Admitting: Orthopedic Surgery

## 2020-07-15 DIAGNOSIS — M6751 Plica syndrome, right knee: Secondary | ICD-10-CM | POA: Insufficient documentation

## 2020-07-15 DIAGNOSIS — Z87891 Personal history of nicotine dependence: Secondary | ICD-10-CM | POA: Diagnosis not present

## 2020-07-15 DIAGNOSIS — Z8571 Personal history of Hodgkin lymphoma: Secondary | ICD-10-CM | POA: Diagnosis not present

## 2020-07-15 DIAGNOSIS — M2241 Chondromalacia patellae, right knee: Secondary | ICD-10-CM | POA: Diagnosis not present

## 2020-07-15 DIAGNOSIS — Z79899 Other long term (current) drug therapy: Secondary | ICD-10-CM | POA: Insufficient documentation

## 2020-07-15 DIAGNOSIS — M795 Residual foreign body in soft tissue: Secondary | ICD-10-CM | POA: Insufficient documentation

## 2020-07-15 DIAGNOSIS — M94261 Chondromalacia, right knee: Secondary | ICD-10-CM | POA: Diagnosis not present

## 2020-07-15 DIAGNOSIS — Z793 Long term (current) use of hormonal contraceptives: Secondary | ICD-10-CM | POA: Insufficient documentation

## 2020-07-15 HISTORY — DX: Personal history of antineoplastic chemotherapy: Z92.21

## 2020-07-15 HISTORY — PX: MINOR HARDWARE REMOVAL: SHX6474

## 2020-07-15 HISTORY — DX: Gastro-esophageal reflux disease without esophagitis: K21.9

## 2020-07-15 HISTORY — PX: KNEE ARTHROSCOPY WITH EXCISION PLICA: SHX5647

## 2020-07-15 LAB — COMPREHENSIVE METABOLIC PANEL
ALT: 16 U/L (ref 0–44)
AST: 22 U/L (ref 15–41)
Albumin: 4.1 g/dL (ref 3.5–5.0)
Alkaline Phosphatase: 41 U/L (ref 38–126)
Anion gap: 9 (ref 5–15)
BUN: 10 mg/dL (ref 6–20)
CO2: 24 mmol/L (ref 22–32)
Calcium: 8.7 mg/dL — ABNORMAL LOW (ref 8.9–10.3)
Chloride: 101 mmol/L (ref 98–111)
Creatinine, Ser: 0.8 mg/dL (ref 0.44–1.00)
GFR, Estimated: >60 mL/min (ref >60)
Glucose, Bld: 86 mg/dL (ref 70–99)
Potassium: 4 mmol/L (ref 3.5–5.1)
Sodium: 134 mmol/L — ABNORMAL LOW (ref 135–145)
Total Bilirubin: 0.8 mg/dL (ref 0.3–1.2)
Total Protein: 7 g/dL (ref 6.5–8.1)

## 2020-07-15 LAB — CBC
HCT: 37.8 % (ref 36.0–46.0)
Hemoglobin: 12.5 g/dL (ref 12.0–15.0)
MCH: 29.8 pg (ref 26.0–34.0)
MCHC: 33.1 g/dL (ref 30.0–36.0)
MCV: 90.2 fL (ref 80.0–100.0)
Platelets: 253 10*3/uL (ref 150–400)
RBC: 4.19 MIL/uL (ref 3.87–5.11)
RDW: 12.2 % (ref 11.5–15.5)
WBC: 6.3 10*3/uL (ref 4.0–10.5)
nRBC: 0 % (ref 0.0–0.2)

## 2020-07-15 SURGERY — ARTHROSCOPY, KNEE, WITH PLICA EXCISION
Anesthesia: General | Site: Knee | Laterality: Right

## 2020-07-15 MED ORDER — LIDOCAINE 2% (20 MG/ML) 5 ML SYRINGE
INTRAMUSCULAR | Status: DC | PRN
  Administered 2020-07-15: 20 mg via INTRAVENOUS

## 2020-07-15 MED ORDER — FENTANYL CITRATE (PF) 100 MCG/2ML IJ SOLN: 25.0000 ug | INTRAMUSCULAR | Status: DC | PRN

## 2020-07-15 MED ORDER — ROPIVACAINE HCL 5 MG/ML IJ SOLN
INTRAMUSCULAR | Status: DC | PRN
  Administered 2020-07-15: 20 mL via PERINEURAL

## 2020-07-15 MED ORDER — PROPOFOL 1000 MG/100ML IV EMUL
INTRAVENOUS | Status: AC
  Filled 2020-07-15: qty 100

## 2020-07-15 MED ORDER — OXYCODONE HCL 5 MG PO TABS: 5.0000 mg | ORAL_TABLET | Freq: Once | ORAL | Status: AC

## 2020-07-15 MED ORDER — DEXAMETHASONE SODIUM PHOSPHATE 10 MG/ML IJ SOLN
INTRAMUSCULAR | Status: AC
  Filled 2020-07-15: qty 1

## 2020-07-15 MED ORDER — LIDOCAINE HCL (PF) 2 % IJ SOLN
INTRAMUSCULAR | Status: AC
  Filled 2020-07-15: qty 5

## 2020-07-15 MED ORDER — KETOROLAC TROMETHAMINE 30 MG/ML IJ SOLN
INTRAMUSCULAR | Status: DC | PRN
  Administered 2020-07-15: 30 mg via INTRAVENOUS

## 2020-07-15 MED ORDER — ACETAMINOPHEN 10 MG/ML IV SOLN
INTRAVENOUS | Status: DC | PRN
  Administered 2020-07-15: 1000 mg via INTRAVENOUS

## 2020-07-15 MED ORDER — FENTANYL CITRATE (PF) 100 MCG/2ML IJ SOLN
INTRAMUSCULAR | Status: AC
  Administered 2020-07-15: 50 ug via INTRAVENOUS
  Filled 2020-07-15: qty 2

## 2020-07-15 MED ORDER — OXYCODONE HCL 5 MG PO TABS
5.0000 mg | ORAL_TABLET | Freq: Four times a day (QID) | ORAL | 0 refills | Status: DC | PRN
Start: 2020-07-15 — End: 2020-07-19

## 2020-07-15 MED ORDER — FENTANYL CITRATE (PF) 100 MCG/2ML IJ SOLN
INTRAMUSCULAR | Status: DC | PRN
  Administered 2020-07-15 (×2): 25 ug via INTRAVENOUS
  Administered 2020-07-15: 50 ug via INTRAVENOUS

## 2020-07-15 MED ORDER — ACETAMINOPHEN 500 MG PO TABS: 1000.0000 mg | ORAL_TABLET | Freq: Once | ORAL | Status: DC

## 2020-07-15 MED ORDER — DEXAMETHASONE SODIUM PHOSPHATE 10 MG/ML IJ SOLN
INTRAMUSCULAR | Status: DC | PRN
  Administered 2020-07-15: 5 mg via INTRAVENOUS

## 2020-07-15 MED ORDER — SODIUM CHLORIDE 0.9 % IR SOLN
Status: DC | PRN
  Administered 2020-07-15: 6000 mL

## 2020-07-15 MED ORDER — MIDAZOLAM HCL 2 MG/2ML IJ SOLN: 1.0000 mg | INTRAMUSCULAR | Status: DC

## 2020-07-15 MED ORDER — PROPOFOL 10 MG/ML IV BOLUS
INTRAVENOUS | Status: AC
  Filled 2020-07-15: qty 20

## 2020-07-15 MED ORDER — ORAL CARE MOUTH RINSE: 15.0000 mL | Freq: Once | OROMUCOSAL | Status: AC

## 2020-07-15 MED ORDER — MIDAZOLAM HCL 2 MG/2ML IJ SOLN
INTRAMUSCULAR | Status: AC
  Administered 2020-07-15: 2 mg via INTRAVENOUS
  Filled 2020-07-15: qty 2

## 2020-07-15 MED ORDER — BUPIVACAINE-EPINEPHRINE 0.5% -1:200000 IJ SOLN
INTRAMUSCULAR | Status: AC
  Filled 2020-07-15: qty 1

## 2020-07-15 MED ORDER — AMISULPRIDE (ANTIEMETIC) 5 MG/2ML IV SOLN: 10.0000 mg | Freq: Once | INTRAVENOUS | Status: DC | PRN

## 2020-07-15 MED ORDER — OXYCODONE HCL 5 MG PO TABS
ORAL_TABLET | ORAL | Status: AC
  Administered 2020-07-15: 5 mg
  Filled 2020-07-15: qty 1

## 2020-07-15 MED ORDER — CLONIDINE HCL (ANALGESIA) 100 MCG/ML EP SOLN
EPIDURAL | Status: DC | PRN
  Administered 2020-07-15: 50 ug

## 2020-07-15 MED ORDER — OXYCODONE HCL 5 MG PO TABS
ORAL_TABLET | ORAL | Status: AC
  Administered 2020-07-15: 5 mg via ORAL
  Filled 2020-07-15: qty 1

## 2020-07-15 MED ORDER — IBUPROFEN 800 MG PO TABS: 800.0000 mg | ORAL_TABLET | Freq: Three times a day (TID) | ORAL | 0 refills | Status: DC | PRN

## 2020-07-15 MED ORDER — EPHEDRINE SULFATE-NACL 50-0.9 MG/10ML-% IV SOSY
PREFILLED_SYRINGE | INTRAVENOUS | Status: DC | PRN
  Administered 2020-07-15 (×3): 10 mg via INTRAVENOUS

## 2020-07-15 MED ORDER — CELECOXIB 200 MG PO CAPS: 200.0000 mg | ORAL_CAPSULE | Freq: Once | ORAL | Status: DC

## 2020-07-15 MED ORDER — MIDAZOLAM HCL 2 MG/2ML IJ SOLN
INTRAMUSCULAR | Status: AC
  Filled 2020-07-15: qty 2

## 2020-07-15 MED ORDER — LACTATED RINGERS IV SOLN: INTRAVENOUS | Status: DC

## 2020-07-15 MED ORDER — ONDANSETRON HCL 4 MG/2ML IJ SOLN
INTRAMUSCULAR | Status: AC
  Filled 2020-07-15: qty 2

## 2020-07-15 MED ORDER — CHLORHEXIDINE GLUCONATE 0.12 % MT SOLN
15.0000 mL | Freq: Once | OROMUCOSAL | Status: AC
  Administered 2020-07-15: 15 mL via OROMUCOSAL

## 2020-07-15 MED ORDER — MIDAZOLAM HCL 5 MG/5ML IJ SOLN
INTRAMUSCULAR | Status: DC | PRN
  Administered 2020-07-15: 2 mg via INTRAVENOUS

## 2020-07-15 MED ORDER — ACETAMINOPHEN 10 MG/ML IV SOLN
INTRAVENOUS | Status: AC
  Filled 2020-07-15: qty 100

## 2020-07-15 MED ORDER — FENTANYL CITRATE (PF) 100 MCG/2ML IJ SOLN
INTRAMUSCULAR | Status: AC
  Filled 2020-07-15: qty 2

## 2020-07-15 MED ORDER — ONDANSETRON HCL 4 MG/2ML IJ SOLN
INTRAMUSCULAR | Status: DC | PRN
  Administered 2020-07-15: 4 mg via INTRAVENOUS

## 2020-07-15 MED ORDER — PROPOFOL 10 MG/ML IV BOLUS
INTRAVENOUS | Status: DC | PRN
  Administered 2020-07-15: 200 mg via INTRAVENOUS

## 2020-07-15 MED ORDER — FENTANYL CITRATE (PF) 100 MCG/2ML IJ SOLN: 50.0000 ug | INTRAMUSCULAR | Status: DC

## 2020-07-15 SURGICAL SUPPLY — 36 items
BLADE SURG 15 STRL LF DISP TIS (BLADE) ×1 IMPLANT
BLADE SURG 15 STRL SS (BLADE) ×1
BNDG ELASTIC 6X5.8 VLCR STR LF (GAUZE/BANDAGES/DRESSINGS) ×2 IMPLANT
COVER SURGICAL LIGHT HANDLE (MISCELLANEOUS) ×2 IMPLANT
COVER WAND RF STERILE (DRAPES) IMPLANT
DISSECTOR 4.0MM X 13CM (MISCELLANEOUS) ×2 IMPLANT
DRAPE U-SHAPE 47X51 STRL (DRAPES) ×2 IMPLANT
DRSG PAD ABDOMINAL 8X10 ST (GAUZE/BANDAGES/DRESSINGS) ×2 IMPLANT
DURAPREP 26ML APPLICATOR (WOUND CARE) ×2 IMPLANT
GAUZE SPONGE 4X4 12PLY STRL (GAUZE/BANDAGES/DRESSINGS) ×2 IMPLANT
GAUZE XEROFORM 1X8 LF (GAUZE/BANDAGES/DRESSINGS) ×2 IMPLANT
GLOVE BIOGEL M STRL SZ7.5 (GLOVE) ×2 IMPLANT
GLOVE BIOGEL PI IND STRL 7.5 (GLOVE) ×1 IMPLANT
GLOVE BIOGEL PI IND STRL 8.5 (GLOVE) ×2 IMPLANT
GLOVE BIOGEL PI INDICATOR 7.5 (GLOVE) ×1
GLOVE BIOGEL PI INDICATOR 8.5 (GLOVE) ×2
GLOVE ORTHO TXT STRL SZ7.5 (GLOVE) ×2 IMPLANT
GLOVE SURG ORTHO 8.0 STRL STRW (GLOVE) ×4 IMPLANT
GOWN STRL REUS W/ TWL XL LVL3 (GOWN DISPOSABLE) ×2 IMPLANT
GOWN STRL REUS W/TWL XL LVL3 (GOWN DISPOSABLE) ×2
IV LACTATED RINGER IRRG 3000ML (IV SOLUTION) ×4
IV LR IRRIG 3000ML ARTHROMATIC (IV SOLUTION) ×4 IMPLANT
KIT BASIN OR (CUSTOM PROCEDURE TRAY) ×2 IMPLANT
MANIFOLD NEPTUNE II (INSTRUMENTS) ×4 IMPLANT
PACK ARTHROSCOPY WL (CUSTOM PROCEDURE TRAY) ×2 IMPLANT
PADDING CAST COTTON 6X4 STRL (CAST SUPPLIES) ×2 IMPLANT
PENCIL SMOKE EVACUATOR (MISCELLANEOUS) IMPLANT
PROBE BIPOLAR ATHRO 135MM 90D (MISCELLANEOUS) IMPLANT
PROTECTOR NERVE ULNAR (MISCELLANEOUS) ×2 IMPLANT
SUCTION FRAZIER HANDLE 12FR (TUBING) ×1
SUCTION TUBE FRAZIER 12FR DISP (TUBING) ×1 IMPLANT
SUT ETHILON 3 0 FSL (SUTURE) ×2 IMPLANT
SUT ETHILON 4 0 PS 2 18 (SUTURE) ×2 IMPLANT
TOWEL OR 17X26 10 PK STRL BLUE (TOWEL DISPOSABLE) ×2 IMPLANT
TUBING ARTHROSCOPY IRRIG 16FT (MISCELLANEOUS) ×2 IMPLANT
TUBING CONNECTING 10 (TUBING) ×2 IMPLANT

## 2020-07-15 NOTE — Anesthesia Procedure Notes (Signed)
Procedure Name: LMA Insertion Performed by: Rosaland Lao, CRNA Pre-anesthesia Checklist: Patient identified, Emergency Drugs available, Suction available and Patient being monitored Patient Re-evaluated:Patient Re-evaluated prior to induction Oxygen Delivery Method: Circle system utilized Preoxygenation: Pre-oxygenation with 100% oxygen Induction Type: IV induction LMA: LMA inserted LMA Size: 4.0 Number of attempts: 1 Dental Injury: Teeth and Oropharynx as per pre-operative assessment

## 2020-07-15 NOTE — Discharge Instructions (Signed)
Last Tylenol 1000mg  IV at 12:53 pm Last Oxycodone IR 5mg  at 1:31 pm Take Pain medication with food when Pain is 3 or greater on 3/10 scale and only as ordered.  Also use Ice for pain.  You may not drive while taking perscription pain medications as directed.   General Anesthesia, Adult, Care After This sheet gives you information about how to care for yourself after your procedure. Your health care provider may also give you more specific instructions. If you have problems or questions, contact your health care provider. What can I expect after the procedure? After the procedure, the following side effects are common:  Pain or discomfort at the IV site.  Nausea.  Vomiting.  Sore throat.  Trouble concentrating.  Feeling cold or chills.  Weak or tired.  Sleepiness and fatigue.  Soreness and body aches. These side effects can affect parts of the body that were not involved in surgery. Follow these instructions at home:  For at least 24 hours after the procedure:  Have a responsible adult stay with you. It is important to have someone help care for you until you are awake and alert.  Rest as needed.  Do not: ? Participate in activities in which you could fall or become injured. ? Drive. ? Use heavy machinery. ? Drink alcohol. ? Take sleeping pills or medicines that cause drowsiness. ? Make important decisions or sign legal documents. ? Take care of children on your own. Eating and drinking  Follow any instructions from your health care provider about eating or drinking restrictions.  When you feel hungry, start by eating small amounts of foods that are soft and easy to digest (bland), such as toast. Gradually return to your regular diet.  Drink enough fluid to keep your urine pale yellow.  If you vomit, rehydrate by drinking water, juice, or clear broth. General instructions  If you have sleep apnea, surgery and certain medicines can increase your risk for breathing  problems. Follow instructions from your health care provider about wearing your sleep device: ? Anytime you are sleeping, including during daytime naps. ? While taking prescription pain medicines, sleeping medicines, or medicines that make you drowsy.  Return to your normal activities as told by your health care provider. Ask your health care provider what activities are safe for you.  Take over-the-counter and prescription medicines only as told by your health care provider.  If you smoke, do not smoke without supervision.  Keep all follow-up visits as told by your health care provider. This is important. Contact a health care provider if:  You have nausea or vomiting that does not get better with medicine.  You cannot eat or drink without vomiting.  You have pain that does not get better with medicine.  You are unable to pass urine.  You develop a skin rash.  You have a fever.  You have redness around your IV site that gets worse. Get help right away if:  You have difficulty breathing.  You have chest pain.  You have blood in your urine or stool, or you vomit blood. Summary  After the procedure, it is common to have a sore throat or nausea. It is also common to feel tired.  Have a responsible adult stay with you for the first 24 hours after general anesthesia. It is important to have someone help care for you until you are awake and alert.  When you feel hungry, start by eating small amounts of foods that are soft  and easy to digest (bland), such as toast. Gradually return to your regular diet.  Drink enough fluid to keep your urine pale yellow.  Return to your normal activities as told by your health care provider. Ask your health care provider what activities are safe for you. This information is not intended to replace advice given to you by your health care provider. Make sure you discuss any questions you have with your health care provider. Document Revised:  07/30/2017 Document Reviewed: 03/12/2017 Elsevier Patient Education  [place].

## 2020-07-15 NOTE — Anesthesia Preprocedure Evaluation (Addendum)
Anesthesia Evaluation  Patient identified by MRN, date of birth, ID band Patient awake    Reviewed: Allergy & Precautions, NPO status , Patient's Chart, lab work & pertinent test results  Airway Mallampati: II  TM Distance: >3 FB Neck ROM: Full    Dental  (+) Dental Advisory Given   Pulmonary asthma , former smoker,    breath sounds clear to auscultation       Cardiovascular negative cardio ROS   Rhythm:Regular Rate:Normal     Neuro/Psych negative neurological ROS     GI/Hepatic GERD  ,(+) Hepatitis -, C  Endo/Other  negative endocrine ROS  Renal/GU negative Renal ROS     Musculoskeletal   Abdominal   Peds  Hematology negative hematology ROS (+)   Anesthesia Other Findings   Reproductive/Obstetrics                             Lab Results  Component Value Date   WBC 6.3 07/15/2020   HGB 12.5 07/15/2020   HCT 37.8 07/15/2020   MCV 90.2 07/15/2020   PLT 253 07/15/2020   Lab Results  Component Value Date   CREATININE 0.80 07/15/2020   BUN 10 07/15/2020   NA 134 (L) 07/15/2020   K 4.0 07/15/2020   CL 101 07/15/2020   CO2 24 07/15/2020    Anesthesia Physical Anesthesia Plan  ASA: II  Anesthesia Plan: General   Post-op Pain Management:  Regional for Post-op pain   Induction: Intravenous  PONV Risk Score and Plan: 3 and Dexamethasone, Ondansetron, Midazolam and Treatment may vary due to age or medical condition  Airway Management Planned: LMA  Additional Equipment:   Intra-op Plan:   Post-operative Plan: Extubation in OR  Informed Consent: I have reviewed the patients History and Physical, chart, labs and discussed the procedure including the risks, benefits and alternatives for the proposed anesthesia with the patient or authorized representative who has indicated his/her understanding and acceptance.     Dental advisory given  Plan Discussed with:  CRNA  Anesthesia Plan Comments:        Anesthesia Quick Evaluation

## 2020-07-15 NOTE — H&P (Signed)
Valerie Johnson MRN:  [id number] DOB/SEX:  1985-12-26/female  CHIEF COMPLAINT:  Painful right Knee  HISTORY: Patient is a 34 y.o. female presented with a history of pain in the right knee. Onset of symptoms was gradual starting a few months ago with gradually worsening course since that time. Patient has been treated conservatively with over-the-counter NSAIDs and activity modification. Patient currently rates pain in the knee at 10 out of 10 with activity. There is pain at night.  PAST MEDICAL HISTORY: There are no problems to display for this patient.  Past Medical History:  Diagnosis Date  . Arthritis   . Asthma    no issues in about 3 years  . Depression   . Hepatitis    Hep c no treament Was detected and when went to get treatment it was no longer detected  . Hodgkin lymphoma ([place])   . Kidney infection    Past Surgical History:  Procedure Laterality Date  . BREAST SURGERY     R lumpectomy  . CESAREAN SECTION    . CHOLECYSTECTOMY    . KNEE SURGERY     right knee knee arthroscopic. MPFL  . WISDOM TOOTH EXTRACTION       MEDICATIONS:   Medications Prior to Admission  Medication Sig Dispense Refill Last Dose  . ARIPiprazole (ABILIFY) 2 MG tablet Take 2 mg by mouth daily.     . cetirizine (ZYRTEC) 10 MG tablet Take 10 mg by mouth daily.      Marland Kitchen escitalopram (LEXAPRO) 20 MG tablet Take 20 mg by mouth at bedtime.     Marland Kitchen ibuprofen (ADVIL) 200 MG tablet Take 800 mg by mouth 2 (two) times daily as needed (pain.).     Marland Kitchen levonorgestrel (MIRENA) 20 MCG/24HR IUD 1 each by Intrauterine route once.     Marland Kitchen omeprazole (PRILOSEC) 40 MG capsule Take 40 mg daily by mouth.     . traZODone (DESYREL) 100 MG tablet Take 100 mg by mouth at bedtime.        ALLERGIES:  No Known Allergies  REVIEW OF SYSTEMS:  A comprehensive review of systems was negative except for: Musculoskeletal: positive for arthralgias and bone pain   FAMILY HISTORY:  History reviewed. No pertinent family history.   SOCIAL HISTORY:   Social History   Tobacco Use  . Smoking status: Former Smoker    Years: 15.00    Types: Cigarettes  . Smokeless tobacco: Never Used  . Tobacco comment: Quit 2018  Substance Use Topics  . Alcohol use: Not Currently     EXAMINATION:  Vital signs in last 24 hours:    Ht 5\' 5"  (1.651 m)   Wt 77.1 kg   BMI 28.29 kg/m   General Appearance:    Alert, cooperative, no distress, appears stated age  Head:    Normocephalic, without obvious abnormality, atraumatic  Eyes:    PERRL, conjunctiva/corneas clear, EOM's intact, fundi    benign, both eyes  Ears:    Normal TM's and external ear canals, both ears  Nose:   Nares normal, septum midline, mucosa normal, no drainage    or sinus tenderness  Throat:   Lips, mucosa, and tongue normal; teeth and gums normal  Neck:   Supple, symmetrical, trachea midline, no adenopathy;    thyroid:  no enlargement/tenderness/nodules; no carotid   bruit or JVD  Back:     Symmetric, no curvature, ROM normal, no CVA tenderness  Lungs:     Clear to auscultation bilaterally, respirations unlabored  Chest Wall:    No tenderness or deformity   Heart:    Regular rate and rhythm, S1 and S2 normal, no murmur, rub   or gallop  Breast Exam:    No tenderness, masses, or nipple abnormality  Abdomen:     Soft, non-tender, bowel sounds active all four quadrants,    no masses, no organomegaly  Genitalia:    Normal female without lesion, discharge or tenderness  Rectal:    Normal tone, no masses or tenderness;   guaiac negative stool  Extremities:   Extremities normal, atraumatic, no cyanosis or edema  Pulses:   2+ and symmetric all extremities  Skin:   Skin color, texture, turgor normal, no rashes or lesions  Lymph nodes:   Cervical, supraclavicular, and axillary nodes normal  Neurologic:   CNII-XII intact, normal strength, sensation and reflexes    throughout    Musculoskeletal:  ROM 0-120, Ligaments intact,  Imaging Review Plain radiographs  demonstrate 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 The overall alignment is neutral. The bone quality appears to be good for age and reported activity level.  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 The patient has failed conservative treatment.  The clearance notes were reviewed.  After discussion with the patient it was felt that right knee arthroscopy was indicated. The procedure,  risks, and benefits of knee arthroscopy were presented and reviewed. he patient acknowledged the explanation, agreed to proceed with the plan.    Donia Ast 07/15/2020, 8:50 AM

## 2020-07-15 NOTE — Anesthesia Postprocedure Evaluation (Signed)
Anesthesia Post Note  Patient: Valerie Johnson  Procedure(s) Performed: KNEE ARTHROSCOPY, loose body removal (Right Knee) MINOR HARDWARE REMOVAL (Right Knee)     Patient location during evaluation: PACU Anesthesia Type: General Level of consciousness: awake and alert Pain management: pain level controlled Vital Signs Assessment: post-procedure vital signs reviewed and stable Respiratory status: spontaneous breathing, nonlabored ventilation, respiratory function stable and patient connected to nasal cannula oxygen Cardiovascular status: blood pressure returned to baseline and stable Postop Assessment: no apparent nausea or vomiting Anesthetic complications: no   No complications documented.  Last Vitals:  Vitals:   07/15/20 1400 07/15/20 1430  BP: 117/77 (!) 120/95  Pulse: 82 82  Resp: 16 16  Temp:  36.6 C  SpO2: 100% 100%    Last Pain:  Vitals:   07/15/20 1455  TempSrc:   PainSc: 5                  Tiajuana Amass

## 2020-07-15 NOTE — Transfer of Care (Signed)
Immediate Anesthesia Transfer of Care Note  Patient: Valerie Johnson  Procedure(s) Performed: KNEE ARTHROSCOPY, loose body removal (Right Knee) MINOR HARDWARE REMOVAL (Right Knee)  Patient Location: PACU  Anesthesia Type:General  Level of Consciousness: awake, alert  and oriented  Airway & Oxygen Therapy: Patient Spontanous Breathing and Patient connected to face mask  Post-op Assessment: Report given to RN and Post -op Vital signs reviewed and stable  Post vital signs: Reviewed and stable  Last Vitals:  Vitals Value Taken Time  BP 117/70 07/15/20 1311  Temp    Pulse 95 07/15/20 1312  Resp 16 07/15/20 1312  SpO2 100 % 07/15/20 1312  Vitals shown include unvalidated device data.  Last Pain:  Vitals:   07/15/20 0912  TempSrc:   PainSc: 4       Patients Stated Pain Goal: 3 (07/28/74 8832)  Complications: No complications documented.

## 2020-07-15 NOTE — Progress Notes (Signed)
AssistedDr. Rob Fitzgerald with right, ultrasound guided, adductor canal block. Side rails up, monitors on throughout procedure. See vital signs in flow sheet. Tolerated Procedure well.  

## 2020-07-15 NOTE — Anesthesia Procedure Notes (Signed)
Anesthesia Regional Block: Adductor canal block   Pre-Anesthetic Checklist: ,, timeout performed, Correct Patient, Correct Site, Correct Laterality, Correct Procedure, Correct Position, site marked, Risks and benefits discussed,  Surgical consent,  Pre-op evaluation,  At surgeon's request and post-op pain management  Laterality: Right  Prep: chloraprep       Needles:  Injection technique: Single-shot  Needle Type: Echogenic Needle     Needle Length: 9cm  Needle Gauge: 21     Additional Needles:   Procedures:,,,, ultrasound used (permanent image in chart),,,,  Narrative:  Start time: 07/15/2020 11:17 AM End time: 07/15/2020 11:23 AM Injection made incrementally with aspirations every 5 mL.  Performed by: Personally  Anesthesiologist: Suzette Battiest, MD

## 2020-07-16 ENCOUNTER — Encounter (HOSPITAL_COMMUNITY): Payer: Self-pay | Admitting: Orthopedic Surgery

## 2020-07-18 NOTE — Op Note (Signed)
Preop diagnosis: Right knee plica and painful hardware  Postop diagnosis: Same  Procedure right knee arthroscopy with plica ectomy and open foreign body removal  Indication for procedure: The patient is a 34 year old white female who is status post an MPFL reconstruction done elsewhere.  She had a prominent foreign body from the repair that she wished to have removed as well as an intra-articular plica.  Informed consent was obtained.  Description of procedure:  The patient was taken to the operating room and placed under general LMA anesthesia with the right leg prepped and draped in usual fashion.  Inferolateral and inferomedial portals were created with a #11 blade blunt trocar and cannula.  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  There was a large plica which was debrided with a small great light shaver.  I toured the medial and lateral compartments that were normal.  I then removed the scope and the fluid.  I then made an incision directly over the area that she identified as the painful hardware or foreign body.  I dissected down sharply and found a large knot of fibers tape and remove this sharply with a scalpel.  I then lavaged this and closed with 2-0 Vicryls Steri-Strips.  I placed 4-0 nylon interrupted sutures in the portals.  I dressed with Xeroform dressing sponges sterile warble and an Ace wrap.  Complications: None.  EBL minimal.  Vickey Huger, MD

## 2020-07-19 ENCOUNTER — Other Ambulatory Visit (HOSPITAL_COMMUNITY): Payer: Self-pay | Admitting: Surgical

## 2020-07-19 MED ORDER — OXYCODONE HCL 5 MG PO TABS: 5.0000 mg | ORAL_TABLET | Freq: Four times a day (QID) | ORAL | 0 refills | Status: AC | PRN

## 2020-12-09 ENCOUNTER — Other Ambulatory Visit: Payer: Self-pay | Admitting: Oncology

## 2020-12-09 DIAGNOSIS — C8118 Nodular sclerosis classical Hodgkin lymphoma, lymph nodes of multiple sites: Secondary | ICD-10-CM

## 2020-12-09 NOTE — Progress Notes (Deleted)
[facility]  [address] [place],  [place]  [address] [phone]  Clinic Day:  12/09/2020  Referring physician: Maggie Schwalbe, PA-C   HISTORY OF PRESENT ILLNESS:  The patient is a 35 y.o. female with a history of Hodgkin lymphoma, who completed 6 cycles of ABVD chemotherapy in 2013.  The patient comes in today off-schedule as she has had night sweats for the last 2 months.  She recalls them being one of the symptoms that led to the initial workup and diagnosis of Hodgkin lymphoma.  This symptom has her worried about her disease having late recurrence.  She denies having any bulky lymphadenopathy, fevers, or unexplained weight loss.  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  However, she was apparently told that her genotype of hepatitis C may not be amenable to treatment.  Nevertheless, the patient never sought attention for this, nor has undergone viral load testing to determine the current status of her hepatitis-C.  PHYSICAL EXAM:  There were no vitals taken for this visit. Wt Readings from Last 3 Encounters:  07/15/20 174 lb 9.6 oz (79.2 kg)  06/19/20 170 lb (77.1 kg)   There is no height or weight on file to calculate BMI. Performance status (ECOG): {CHL ONC [id number] Physical Exam  LABS:   CBC Latest Ref Rng & Units 07/15/2020 06/19/2020 07/23/2010  WBC 4.0 - 10.5 K/uL 6.3 5.3 10.6(H)  Hemoglobin 12.0 - 15.0 g/dL 12.5 12.9 10.4(L)  Hematocrit 36.0 - 46.0 % 37.8 38.3 32.7(L)  Platelets 150 - 400 K/uL 253 261 384   CMP Latest Ref Rng & Units 07/15/2020 06/19/2020 07/23/2010  Glucose 70 - 99 mg/dL 86 84 149(H)  BUN 6 - 20 mg/dL 10 8 6   Creatinine 0.44 - 1.00 mg/dL 0.80 0.71 0.59  Sodium 135 - 145 mmol/L 134(L) 138 138  Potassium 3.5 - 5.1 mmol/L 4.0 4.6 3.2(L)  Chloride 98 - 111 mmol/L 101 106 104  CO2 22 - 32 mmol/L 24 24 27   Calcium 8.9 - 10.3 mg/dL 8.7(L) 9.0 8.4  Total Protein 6.5 - 8.1  g/dL 7.0 7.3 7.0  Total Bilirubin 0.3 - 1.2 mg/dL 0.8 0.8 0.3  Alkaline Phos 38 - 126 U/L 41 32(L) 107  AST 15 - 41 U/L 22 15 17   ALT 0 - 44 U/L 16 13 32     No results found for: CEA1 / No results found for: CEA1 No results found for: PSA1 No results found for: XLK440 No results found for: CAN125  No results found for: TOTALPROTELP, ALBUMINELP, A1GS, A2GS, BETS, BETA2SER, GAMS, MSPIKE, SPEI No results found for: TIBC, FERRITIN, IRONPCTSAT No results found for: LDH  No results found for: AFPTUMOR, TOTALPROTELP, ALBUMINELP, A1GS, A2GS, BETS, BETA2SER, GAMS, MSPIKE, SPEI, LDH, CEA1, PSA1, IGASERUM, IGGSERUM, IGMSERUM, THGAB, THYROGLB  Recent Review Flowsheet Data   There is no flowsheet data to display.      STUDIES:  No results found.    ASSESSMENT & PLAN:   Assessment/Plan:  A 35 y.o. female with a history of stage IIIB Hodgkin lymphoma, status post 6 cycles of ABVD chemotherapy in January 2013.  Based upon her labs and physical exam today, I do not get the sense her Hodgkin lymphoma has returned.  She is now over 8 years disease-free.  She is well aware that I consider her cured of this disease.  With respect to her night sweats, my concern is this may be a  possible reflection of her hepatitis-C.  Furthermore, per her labs today, her liver transaminases are elevated.  My recommendation is for her to have her hepatitis C viral load checked by her primary care office.  If elevated, which is likely is, my recommendation would be for her be seen by a GI/liver specialist to undergo treatment for her hepatitis-C.  Current antiviral therapy could ultimately cure her of this viral infection, particularly if she has the proper genotype.  Otherwise, as she has no pressing hematologic issues.  She wishes to come back in 1 year for repeat clinical assessment, for which I have no problem accommodating.  The patient understands all the plans discussed today and is in agreement with them. *** .The  patient understands all the plans discussed today and is in agreement with them.      Lola Czerwonka Macarthur Critchley, MD

## 2020-12-10 ENCOUNTER — Inpatient Hospital Stay: Payer: BC Managed Care – PPO | Admitting: Oncology

## 2020-12-10 ENCOUNTER — Inpatient Hospital Stay: Payer: BC Managed Care – PPO | Attending: Oncology

## 2021-05-24 DIAGNOSIS — D649 Anemia, unspecified: Secondary | ICD-10-CM

## 2021-05-24 DIAGNOSIS — R55 Syncope and collapse: Secondary | ICD-10-CM

## 2021-05-24 DIAGNOSIS — E878 Other disorders of electrolyte and fluid balance, not elsewhere classified: Secondary | ICD-10-CM

## 2021-05-26 ENCOUNTER — Other Ambulatory Visit: Payer: Self-pay | Admitting: *Deleted

## 2021-05-26 ENCOUNTER — Other Ambulatory Visit: Payer: Self-pay

## 2021-05-26 ENCOUNTER — Other Ambulatory Visit (INDEPENDENT_AMBULATORY_CARE_PROVIDER_SITE_OTHER): Payer: BC Managed Care – PPO

## 2021-05-26 DIAGNOSIS — R55 Syncope and collapse: Secondary | ICD-10-CM

## 2021-05-27 ENCOUNTER — Ambulatory Visit (INDEPENDENT_AMBULATORY_CARE_PROVIDER_SITE_OTHER): Payer: BC Managed Care – PPO | Admitting: Neurology

## 2021-05-27 ENCOUNTER — Encounter: Payer: Self-pay | Admitting: Neurology

## 2021-05-27 VITALS — BP 98/71 | HR 74 | Ht 65.0 in | Wt 150.0 lb

## 2021-05-27 DIAGNOSIS — R519 Headache, unspecified: Secondary | ICD-10-CM | POA: Insufficient documentation

## 2021-05-27 DIAGNOSIS — R55 Syncope and collapse: Secondary | ICD-10-CM | POA: Insufficient documentation

## 2021-05-27 MED ORDER — TIZANIDINE HCL 4 MG PO TABS: 4.0000 mg | ORAL_TABLET | Freq: Four times a day (QID) | ORAL | 6 refills | Status: DC | PRN

## 2021-05-27 MED ORDER — SUMATRIPTAN SUCCINATE 100 MG PO TABS: 100.0000 mg | ORAL_TABLET | ORAL | 6 refills | Status: AC | PRN

## 2021-05-27 MED ORDER — TOPIRAMATE 100 MG PO TABS: 200.0000 mg | ORAL_TABLET | Freq: Every day | ORAL | 6 refills | Status: AC

## 2021-05-27 NOTE — Patient Instructions (Signed)
Meds ordered this encounter  Medications   topiramate (TOPAMAX) 100 MG tablet    Sig: Take 2 tablets (200 mg total) by mouth at bedtime.    Dispense:  60 tablet    Refill:  6   SUMAtriptan (IMITREX) 100 MG tablet    Sig: Take 1 tablet (100 mg total) by mouth every 2 (two) hours as needed for migraine. May repeat in 2 hours if headache persists or recurs.    Dispense:  12 tablet    Refill:  6   tiZANidine (ZANAFLEX) 4 MG tablet    Sig: Take 1 tablet (4 mg total) by mouth every 6 (six) hours as needed for muscle spasms.    Dispense:  30 tablet    Refill:  6     Stop frequent daily over-the-counter medication use  For moderate to severe headache, you can take Imitrex, and Zofran for nausea, 1 to 2 tablets of Aleve, tizanidine for muscle relaxant, heating pad.

## 2021-05-27 NOTE — Progress Notes (Addendum)
Chief Complaint  Patient presents with   New Patient (Initial Visit)    RM 13, alone. Had at least one syncopal episode- 2 weeks ago. Possible other event this weekend. Saw cardiology yesterday and is wearing monitor.  Complaining of headache since this weekend.  PCP: Garnetta Buddy, NP      ASSESSMENT AND PLAN  Valerie Johnson is a 35 y.o. female   Headache with migraine features Transient loss of consciousness  History does not suggestive of seizure,  However with her new onset headache, history of lymphoma,  Will proceed with MRI of the brain with without contrast to rule out structural abnormality  Differentiation diagnoses also include basilar migraine  MRA of the brain and neck to rule out vascular abnormality  She is going through cardiology evaluation, Holter monitoring for 2 weeks  She is already on polypharmacy due to depression anxiety, including Abilify, Lexapro, trazodone,  Blood pressure on the low normal range, will start Topamax titrating to 200 mg every night as headache prevention,  Imitrex as needed for moderate to severe headaches, may combine with tizanidine Zofran for prolonged severe headaches, stop daily frequent over-the-counter medication use   DIAGNOSTIC DATA (LABS, IMAGING, TESTING) - I reviewed patient records, labs, notes, testing and imaging myself where available.   MEDICAL HISTORY:  Valerie Johnson is a 35 year old female, seen in request by practitioner nurse Garnetta Buddy I, for evaluation of passing out episode, her primary care is PA nodal, Alphonzo Dublin, initial evaluation was May 27, 2021  I reviewed and summarized the referring note. PMHX. Chronic insomnia Depression,  Hodgkin Lymphoma at age 43 years, received chemotherapy  She denies a previous history of headache, on October 4 around 2 PM, when she was ready to go to work, stepping out of her house, she felt lightheaded, and passed out on the step, was awakened by her dog licking  her face, transient loss of consciousness, had a bump at the right frontal region, denies tongue biting, urinary incontinence, denies chest pain, heart palpitation.  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,  On October 14 10:30 PM, when she was trying to go to the restroom, she felt lightheaded, there was no loss of consciousness, she has nausea, vomiting, was admitted to [facility] reviewed CT head without contrast that was normal Per patient, echocardiogram showed no significant abnormality, laboratory evaluation was within normal limit, CT chest showed no evidence of pulmonary emboli, calcified anterior mediastinal lymph node likely reflect treated lymphoma, age advanced coronary atherosclerosis in LAD,  Over the past couple weeks, she rely on Tylenol 1000 mg every 6 hours, intermittent with ibuprofen 600 mg every 4-6 PHYSICAL EXAM:   Vitals:   05/27/21 1417  BP: 98/71  Pulse: 74  Weight: 150 lb (68 kg)  Height: 5\' 5"  (1.651 m)   Not recorded     Body mass index is 24.96 kg/m.  PHYSICAL EXAMNIATION:  Gen: NAD, conversant, well nourised, well groomed                     Cardiovascular: Regular rate rhythm, no peripheral edema, warm, nontender. Eyes: Conjunctivae  clear without exudates or hemorrhage Neck: Supple, no carotid bruits. Pulmonary: Clear to auscultation bilaterally   NEUROLOGICAL EXAM:  MENTAL STATUS: Mild distress middle-aged female Speech:    Speech is normal; fluent and spontaneous with normal comprehension.  Cognition:     Orientation to time, place and person     Normal recent and remote  memory     Normal Attention span and concentration     Normal Language, naming, repeating,spontaneous speech     Fund of knowledge   CRANIAL NERVES: CN II: Visual fields are full to confrontation. Pupils are round equal and briskly reactive to light. CN III, IV, VI: extraocular movement are normal. No ptosis. CN V: Facial sensation is intact to light touch CN VII: Face is symmetric with normal eye closure  CN VIII: Hearing is normal to causal conversation. CN IX, X: Phonation is normal. CN XI: Head turning and shoulder shrug are intact  MOTOR: There is no pronator drift of out-stretched arms. Muscle bulk and tone are normal. Muscle strength is normal.  REFLEXES: Reflexes are 2+ and symmetric at the biceps, triceps, knees, and ankles. Plantar responses are flexor.  SENSORY: Intact to light touch, pinprick and vibratory sensation are intact in fingers and toes.  COORDINATION: There is no trunk or limb dysmetria noted.  GAIT/STANCE: Posture is normal. Gait is steady with normal steps, base, arm swing, and turning. Heel and toe walking are normal. Tandem gait is normal.  Romberg is absent.  REVIEW OF SYSTEMS:  Full 14 system review of systems performed and notable only for as above All other review of systems were negative.   ALLERGIES: No Known Allergies  HOME MEDICATIONS: Current Outpatient Medications  Medication Sig Dispense Refill   ARIPiprazole (ABILIFY) 2 MG tablet Take 2 mg by mouth daily.     cetirizine (ZYRTEC) 10 MG tablet Take 10 mg by mouth daily.      escitalopram (LEXAPRO) 20 MG tablet Take 20 mg by mouth at bedtime.     ibuprofen (ADVIL) 800 MG tablet Take 1 tablet (800 mg total) by mouth every 8 (eight) hours as needed. 30 tablet 0   levonorgestrel (MIRENA) 20 MCG/24HR IUD 1 each by Intrauterine route once.     omeprazole (PRILOSEC) 40 MG capsule Take 40 mg daily by mouth.     traZODone (DESYREL) 100 MG tablet Take 100 mg by mouth at bedtime.      No  current facility-administered medications for this visit.    PAST MEDICAL HISTORY: Past Medical History:  Diagnosis Date   Acid reflux    Arthritis    Asthma    no issues in about 3 years   Depression    Hepatitis    Hep c no treament Was detected and when went to get treatment it was no longer detected   History of chemotherapy    Hodgkin lymphoma ([place])    Kidney infection     PAST SURGICAL HISTORY: Past Surgical History:  Procedure Laterality Date   BREAST SURGERY     R lumpectomy   CESAREAN SECTION     CHOLECYSTECTOMY     KNEE ARTHROSCOPY WITH EXCISION PLICA Right 96/02/5915   Procedure: KNEE ARTHROSCOPY, loose body removal;  Surgeon: Vickey Huger, MD;  Location: [facility];  Service: Orthopedics;  Laterality: Right;   KNEE SURGERY     right knee knee arthroscopic. MPFL   MINOR HARDWARE REMOVAL Right 07/15/2020   Procedure: MINOR HARDWARE REMOVAL;  Surgeon: Vickey Huger, MD;  Location: [facility];  Service: Orthopedics;  Laterality: Right;   WISDOM TOOTH EXTRACTION      FAMILY HISTORY: History reviewed. No pertinent family history.  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  Other Topics Concern   Not on file  Social History Narrative   Not on file   Social Determinants of Health   Financial Resource Strain: Not on file  Food Insecurity: Not on file  Transportation  Needs: Not on file  Physical Activity: Not on file  Stress: Not on file  Social Connections: Not on file  Intimate Partner Violence: Not on file    Addendum: Unremarkable MRI appearance of the brain from [place] at [facility] dated June 27, 2021  Marcial Pacas, M.D. Ph.D.  [facility] [address], [place], [place] [address] Ph: [phone] Fax: [phone]  CC:  Garnetta Buddy I, NP [place],  [place] [address]  Nodal, Alphonzo Dublin, PA-C

## 2021-05-28 ENCOUNTER — Telehealth: Payer: Self-pay | Admitting: *Deleted

## 2021-05-28 NOTE — Telephone Encounter (Signed)
Per vo by Dr. Krista Blue, can offer nerve block. The patient is agreeable and has been added to her schedule. Dr. Krista Blue may also offer another option for a rescue medication. She will discuss this further at the appt.

## 2021-05-28 NOTE — Telephone Encounter (Signed)
I spoke to the patient and she would like to return to work. 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 She verbalized understanding that per [place] law, she is unable to drive until she has been six months episode free (loss of consciousness reported on 05/13/21). She is also being treated for migraines and will need intermittent time off from her job. For now, she has just started medication and has continued to have frequent headaches. She was given 3-4 days off, lasting 1 day each event (total of 32 hours per month). This number may be reduced in the future as she stabilizes on treatment. No driving for six months was added on the paperwork as a job restriction. She still has several pending tests through our office. She is also in the middle of her cardiology evaluation.   FMLA ppw completed and returned to medical records.

## 2021-05-28 NOTE — Telephone Encounter (Addendum)
Received [employer] paperwork for patient. 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  Left a message for a return call. Need to clarify the time off she is requesting.

## 2021-05-29 ENCOUNTER — Ambulatory Visit: Payer: BC Managed Care – PPO | Admitting: Neurology

## 2021-05-29 ENCOUNTER — Telehealth: Payer: Self-pay | Admitting: Neurology

## 2021-05-29 NOTE — Telephone Encounter (Signed)
Pt no show due to do not wanting needles in her head.

## 2021-06-02 DIAGNOSIS — Z0289 Encounter for other administrative examinations: Secondary | ICD-10-CM

## 2021-06-03 ENCOUNTER — Telehealth: Payer: Self-pay | Admitting: Neurology

## 2021-06-03 NOTE — Telephone Encounter (Signed)
[employer]BS Josem Kaufmann: [phone] (exp. 06/03/21 to 07/02/21) [facility] pending faxed notes

## 2021-06-03 NOTE — Telephone Encounter (Signed)
Error patient does not have Medicare/BCBS.

## 2021-06-03 NOTE — Telephone Encounter (Signed)
Medicare/BCBS auth: [employer] spoke to Ivin Booty ref # [id number] order sent to [facility]. They will reach out to the patient to schedule.

## 2021-06-05 ENCOUNTER — Telehealth: Payer: Self-pay | Admitting: *Deleted

## 2021-06-05 NOTE — Telephone Encounter (Signed)
Left message on pt's voicemail to let us know if she is still wearing the live zio we put on her per Dr. Harriet Masson. Received email from [employer] that leads are no longer attached. Pt had appt in November to see Dr. Bettina Gavia which she has cancelled with no new appt made yet.

## 2021-06-12 NOTE — Telephone Encounter (Signed)
The MRI Brain is approved, the MRA Head and neck is still pending it is in MD review.

## 2021-06-18 NOTE — Telephone Encounter (Signed)
Medicaid [employer] only approved the MRI Brain auth: [id number] (exp. 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   There is an option to do a peer to peer. The phone number is [phone], the tracking number is [id number]. It does not need to be scheduled just called and it would need to be done 10 business days from 06/13/21.

## 2021-06-18 NOTE — Telephone Encounter (Signed)
It is Ok to proceed with MRI brain now, will add on MRA if needed later.

## 2021-06-23 ENCOUNTER — Ambulatory Visit: Payer: BC Managed Care – PPO | Admitting: Cardiology

## 2021-06-23 NOTE — Telephone Encounter (Signed)
Noted, I faxed the order to [facility] for the MRI Brain.and sent the patient a mychart message as well.

## 2021-07-16 ENCOUNTER — Ambulatory Visit (HOSPITAL_COMMUNITY)
Admission: EM | Admit: 2021-07-16 | Discharge: 2021-07-16 | Disposition: A | Discharge status: Home / Self Care | Payer: Medicaid Other | Attending: Family Medicine | Admitting: Family Medicine

## 2021-07-16 ENCOUNTER — Encounter (HOSPITAL_COMMUNITY): Payer: Self-pay

## 2021-07-16 ENCOUNTER — Other Ambulatory Visit: Payer: Self-pay

## 2021-07-16 DIAGNOSIS — M545 Low back pain, unspecified: Secondary | ICD-10-CM | POA: Diagnosis not present

## 2021-07-16 MED ORDER — TIZANIDINE HCL 4 MG PO TABS: 4.0000 mg | ORAL_TABLET | Freq: Every day | ORAL | 0 refills | Status: AC

## 2021-07-16 NOTE — ED Triage Notes (Signed)
Pt presents with c/o back pain. Pt states she thinks she has kidney infection.

## 2021-07-16 NOTE — Discharge Instructions (Signed)
Continue your antibiotics, force fluids along with muscle relaxer recommend changing from ibuprofen and trying naproxen for pain in the back. Once you complete your third dose of antibiotics the back pain should progressively improve.

## 2021-07-16 NOTE — ED Provider Notes (Signed)
[facility]    CSN: [id number] Arrival date & time: 07/16/21  1949      History   Chief Complaint Chief Complaint  Patient presents with   Flank Pain    HPI Valerie Johnson is a 35 y.o. female.   HPI Patient presents for evaluation of low back pain related to UTI.  Patient was seen a few hours ago at [facility] and treated for urinary tract infection.  She complained of low back pain at that time they recommended ibuprofen, Azo's and she reports the pain has not subsided.  Patient reports taking at least 1 dose of her prescribed antibiotics. Past Medical History:  Diagnosis Date   Acid reflux    Arthritis    Asthma    no issues in about 3 years   Depression    Hepatitis    Hep c no treament Was detected and when went to get treatment it was no longer detected   History of chemotherapy    Hodgkin lymphoma ([place])    Kidney infection     Patient Active Problem List   Diagnosis Date Noted   Syncope and collapse 05/27/2021   Nonintractable headache 05/27/2021    Past Surgical History:  Procedure Laterality Date   BREAST SURGERY     R lumpectomy   CESAREAN SECTION     CHOLECYSTECTOMY     KNEE ARTHROSCOPY WITH EXCISION PLICA Right 15/11/84   Procedure: KNEE ARTHROSCOPY, loose body removal;  Surgeon: Vickey Huger, MD;  Location: [facility];  Service: Orthopedics;  Laterality: Right;   KNEE SURGERY     right knee knee arthroscopic. MPFL   MINOR HARDWARE REMOVAL Right 07/15/2020   Procedure: MINOR HARDWARE REMOVAL;  Surgeon: Vickey Huger, MD;  Location: [facility];  Service: Orthopedics;  Laterality: Right;   WISDOM TOOTH EXTRACTION      OB History   No obstetric history on file.      Home Medications    Prior to Admission medications   Medication Sig Start Date End Date Taking? Authorizing Provider  tiZANidine (ZANAFLEX) 4 MG tablet Take 1 tablet (4 mg total) by mouth at bedtime. 07/16/21  Yes Scot Jun, FNP  ARIPiprazole (ABILIFY) 2 MG tablet  Take 2 mg by mouth daily. 04/12/20   [provider]  cetirizine (ZYRTEC) 10 MG tablet Take 10 mg by mouth daily.     [provider]  escitalopram (LEXAPRO) 20 MG tablet Take 20 mg by mouth at bedtime. 04/12/20   [provider]  ibuprofen (ADVIL) 800 MG tablet Take 1 tablet (800 mg total) by mouth every 8 (eight) hours as needed. 07/15/20   Donia Ast, PA  levonorgestrel (MIRENA) 20 MCG/24HR IUD 1 each by Intrauterine route once.    [provider]  omeprazole (PRILOSEC) 40 MG capsule Take 40 mg daily by mouth.    [provider]  SUMAtriptan (IMITREX) 100 MG tablet Take 1 tablet (100 mg total) by mouth every 2 (two) hours as needed for migraine. May repeat in 2 hours if headache persists or recurs. 05/27/21   Marcial Pacas, MD  topiramate (TOPAMAX) 100 MG tablet Take 2 tablets (200 mg total) by mouth at bedtime. 05/27/21   Marcial Pacas, MD  traZODone (DESYREL) 100 MG tablet Take 100 mg by mouth at bedtime.     [provider]    Family History History reviewed. No pertinent family history.  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   Review of Systems Review of Systems Pertinent negatives listed in HPI   Physical Exam Triage Vital Signs ED Triage Vitals  Enc Vitals Group     BP 07/16/21 2000 109/69     Pulse Rate 07/16/21 1959 68     Resp 07/16/21 1959 17     Temp 07/16/21 1959 97.7 F (36.5 C)     Temp Source 07/16/21 1959 Oral     SpO2 07/16/21 2000 97 %     Weight --      Height --      Head Circumference --      Peak Flow --      Pain Score 07/16/21  1957 7     Pain Loc --      Pain Edu? --      Excl. in [place]? --    No data found.  Updated Vital Signs BP 109/69 (BP Location: Right Arm)   Pulse 84   Temp 97.7 F (36.5 C) (Oral)   Resp 17   SpO2 97%   Visual Acuity Right Eye Distance:   Left Eye Distance:   Bilateral Distance:    Right Eye Near:   Left Eye Near:    Bilateral Near:     Physical Exam Constitutional:      Appearance: Normal appearance.  Cardiovascular:     Rate and Rhythm: Normal rate.  Pulmonary:     Effort: Pulmonary effort is normal.  Musculoskeletal:        General: Normal range of motion.  Neurological:     General: No focal deficit present.     Mental Status: She is alert.     UC Treatments / Results  Labs (all labs ordered are listed, but only abnormal results are displayed) Labs Reviewed - No data to display  EKG   Radiology No results found.  Procedures Procedures (including critical care time)  Medications Ordered in UC Medications - No data to display  Initial Impression / Assessment and Plan / UC Course  I have reviewed the triage vital signs and the nursing notes.  Pertinent labs & imaging results that were available during my care of the patient were reviewed by me and considered in my medical decision making (see chart for details).    Acute left-sided low back pain without sciatica Agreed to prescribe a low-dose muscle relaxer to help alleviate back pain. Advised to switch from ibuprofen to naproxen along with applications of heat.  Advised to continue antibiotics. Force fluids.  No additional UTI testing completed.  Final Clinical Impressions(s) / UC Diagnoses   Final diagnoses:  Acute left-sided low back pain without sciatica     Discharge Instructions      Continue your antibiotics, force fluids along with muscle relaxer recommend changing from ibuprofen and trying naproxen for pain in the back. Once you complete your third dose of antibiotics the back pain  should progressively improve.   ED Prescriptions     Medication Sig Dispense Auth. Provider   tiZANidine (ZANAFLEX)  4 MG tablet Take 1 tablet (4 mg total) by mouth at bedtime. 20 tablet Scot Jun, FNP      PDMP not reviewed this encounter.   Scot Jun, FNP 07/16/21 2024

## 2021-07-22 ENCOUNTER — Ambulatory Visit: Payer: BC Managed Care – PPO | Admitting: Neurology

## 2021-07-22 ENCOUNTER — Encounter: Payer: Self-pay | Admitting: Neurology

## 2021-08-30 ENCOUNTER — Other Ambulatory Visit: Payer: Self-pay

## 2021-08-30 ENCOUNTER — Encounter (HOSPITAL_COMMUNITY): Payer: Self-pay | Admitting: *Deleted

## 2021-08-30 ENCOUNTER — Emergency Department (HOSPITAL_COMMUNITY)
Admission: EM | Admit: 2021-08-30 | Discharge: 2021-08-30 | Disposition: A | Discharge status: Home / Self Care | Payer: Medicaid Other | Attending: Emergency Medicine | Admitting: Emergency Medicine

## 2021-08-30 DIAGNOSIS — K219 Gastro-esophageal reflux disease without esophagitis: Secondary | ICD-10-CM | POA: Insufficient documentation

## 2021-08-30 DIAGNOSIS — X102XXA Contact with fats and cooking oils, initial encounter: Secondary | ICD-10-CM | POA: Diagnosis not present

## 2021-08-30 DIAGNOSIS — T23201A Burn of second degree of right hand, unspecified site, initial encounter: Secondary | ICD-10-CM | POA: Diagnosis not present

## 2021-08-30 DIAGNOSIS — J45909 Unspecified asthma, uncomplicated: Secondary | ICD-10-CM | POA: Insufficient documentation

## 2021-08-30 DIAGNOSIS — Z79899 Other long term (current) drug therapy: Secondary | ICD-10-CM | POA: Insufficient documentation

## 2021-08-30 DIAGNOSIS — T23271A Burn of second degree of right wrist, initial encounter: Secondary | ICD-10-CM | POA: Diagnosis not present

## 2021-08-30 DIAGNOSIS — T300 Burn of unspecified body region, unspecified degree: Secondary | ICD-10-CM

## 2021-08-30 DIAGNOSIS — T310 Burns involving less than 10% of body surface: Secondary | ICD-10-CM | POA: Diagnosis not present

## 2021-08-30 MED ORDER — BACITRACIN ZINC 500 UNIT/GM EX OINT: 1.0000 "application " | TOPICAL_OINTMENT | Freq: Two times a day (BID) | CUTANEOUS | 0 refills | Status: AC

## 2021-08-30 MED ORDER — ONDANSETRON 4 MG PO TBDP
4.0000 mg | ORAL_TABLET | Freq: Once | ORAL | Status: AC
  Administered 2021-08-30: 4 mg via ORAL
  Filled 2021-08-30: qty 1

## 2021-08-30 MED ORDER — HYDROCODONE-ACETAMINOPHEN 5-325 MG PO TABS: 1.0000 | ORAL_TABLET | Freq: Four times a day (QID) | ORAL | 0 refills | Status: DC | PRN

## 2021-08-30 MED ORDER — OXYCODONE-ACETAMINOPHEN 5-325 MG PO TABS
1.0000 | ORAL_TABLET | Freq: Once | ORAL | Status: AC
  Administered 2021-08-30: 1 via ORAL
  Filled 2021-08-30: qty 1

## 2021-08-30 NOTE — Discharge Instructions (Addendum)
Use bacitracin twice daily to prevent infection. Wash gently with soap and water daily.   Please call the burn clinic in 2 days to schedule a follow up appointment for your symptoms.   Please return to the emergency department for any new or worsening symptoms.

## 2021-08-30 NOTE — ED Triage Notes (Signed)
Pt spilled hot oil on her rt lower arm about 4 hours ago. Went to [facility] was told to put Bacitracin on arm told her to take tylenol and Motrin, skin intact,  dark in color.

## 2021-08-30 NOTE — ED Provider Notes (Signed)
[facility] DEPT Provider Note   CSN: [id number] Arrival date & time: 08/30/21  1425     History  Chief Complaint  Patient presents with   Burn    Valerie Johnson is a 36 y.o. female.  HPI  36 year old female with a history of GERD, arthritis, asthma, depression, otitis, Hodgkin's lymphoma, who presents to the emergency department today for evaluation of a burn to the right hand.  Patient states she spilled hot oil to the dorsum of the right hand a few hours prior to arrival.  She was seen at [facility] and was advised to apply bacitracin.  She is also given a prescription for pain medications and was discharged.  Her Tdap was updated on this visit.  She presents due to continued pain and ribs is wanting a second opinion about her care.  Home Medications Prior to Admission medications   Medication Sig Start Date End Date Taking? Authorizing Provider  bacitracin ointment Apply 1 application topically 2 (two) times daily. 08/30/21  Yes Kelcie Currie S, PA-C  HYDROcodone-acetaminophen (NORCO/VICODIN) 5-325 MG tablet Take 1 tablet by mouth every 6 (six) hours as needed. 08/30/21  Yes Dariane Natzke S, PA-C  ARIPiprazole (ABILIFY) 2 MG tablet Take 2 mg by mouth daily. 04/12/20   [provider]  cetirizine (ZYRTEC) 10 MG tablet Take 10 mg by mouth daily.     [provider]  escitalopram (LEXAPRO) 20 MG tablet Take 20 mg by mouth at bedtime. 04/12/20   [provider]  ibuprofen (ADVIL) 800 MG tablet Take 1 tablet (800 mg total) by mouth every 8 (eight) hours as needed. 07/15/20   Donia Ast, PA  levonorgestrel (MIRENA) 20 MCG/24HR IUD 1 each by Intrauterine route once.    [provider]  omeprazole (PRILOSEC) 40 MG capsule Take 40 mg daily by mouth.    [provider]  SUMAtriptan (IMITREX) 100 MG tablet Take 1 tablet (100 mg total) by mouth every 2 (two) hours as needed for migraine. May repeat in 2  hours if headache persists or recurs. 05/27/21   Marcial Pacas, MD  tiZANidine (ZANAFLEX) 4 MG tablet Take 1 tablet (4 mg total) by mouth at bedtime. 07/16/21   Scot Jun, FNP  topiramate (TOPAMAX) 100 MG tablet Take 2 tablets (200 mg total) by mouth at bedtime. 05/27/21   Marcial Pacas, MD  traZODone (DESYREL) 100 MG tablet Take 100 mg by mouth at bedtime.     [provider]      Allergies    Patient has no known allergies.    Review of Systems   Review of Systems See HPI for pertinent positives or negatives.   Physical Exam Updated Vital Signs BP (!) 133/103 (BP Location: Left Arm)    Pulse 75    Temp 98.3 F (36.8 C) (Oral)    Resp 16    Ht 5\' 5"  (1.651 m)    Wt 70.3 kg    SpO2 100%    BMI 25.79 kg/m  Physical Exam Vitals and nursing note reviewed.  Constitutional:      General: She is not in acute distress.    Appearance: She is well-developed.  HENT:     Head: Normocephalic and atraumatic.  Eyes:     Conjunctiva/sclera: Conjunctivae normal.  Cardiovascular:     Rate and Rhythm: Normal rate.  Pulmonary:     Effort: Pulmonary effort is normal.  Musculoskeletal:     Cervical back: Neck supple.  Comments: 1% BSA partial-thickness burn to the dorsum of the right hand and slightly over the wrist but is not circumferential.  Radial and ulnar pulses are intact.  Brisk cap refill noted to all fingers.  No blistering noted on exam.  Skin is tender to palpation.  There is no sloughing of the skin.  Full range of motion and strength are currently intact.  Skin:    General: Skin is warm and dry.  Neurological:     Mental Status: She is alert.    ED Results / Procedures / Treatments   Labs (all labs ordered are listed, but only abnormal results are displayed) Labs Reviewed - No data to display  EKG None  Radiology No results found.  Procedures Procedures    Medications Ordered in ED Medications  oxyCODONE-acetaminophen (PERCOCET/ROXICET) 5-325 MG per  tablet 1 tablet (1 tablet Oral Given 08/30/21 1446)  ondansetron (ZOFRAN-ODT) disintegrating tablet 4 mg (4 mg Oral Given 08/30/21 1446)    ED Course/ Medical Decision Making/ A&P                           Medical Decision Making Risk Prescription drug management.   36 year old female presents the emergency department today for evaluation of a burn that occurred earlier today after spilling oil on her hand.  Patient does have about a 1% BSA partial-thickness burn to the dorsum of the right hand and slightly over the wrist but is not circumferential.  Radial and ulnar pulses are intact.  Brisk cap refill noted to all fingers.  No blistering noted on exam.  Skin is tender to palpation.  There is no sloughing of the skin.  Full range of motion and strength are currently intact.  Her Tdap was updated at her prior ED visit.  Wound care was performed and bacitracin was applied.  We discussed typical course of treatment and plan for follow-up.  I do not feel that she requires admission or transfer to a burn center at this time.  Case discussed with Dr. Karle Starch who is in agreement.  Rx given for bacitracin as well as pain medications for home.  She is agreeable to follow-up and we also discussed strict return precautions.  She voiced understanding plan reasons to return.  All questions answered.  Patient stable for discharge.   Final Clinical Impression(s) / ED Diagnoses Final diagnoses:  None    Rx / DC Orders ED Discharge Orders          Ordered    bacitracin ointment  2 times daily        08/30/21 1536    HYDROcodone-acetaminophen (NORCO/VICODIN) 5-325 MG tablet  Every 6 hours PRN        08/30/21 1536              Shavaughn Seidl S, PA-C 08/30/21 1541    Truddie Hidden, MD 08/30/21 [phone]

## 2021-08-31 ENCOUNTER — Other Ambulatory Visit: Payer: Self-pay

## 2021-08-31 ENCOUNTER — Emergency Department (HOSPITAL_COMMUNITY)
Admission: EM | Admit: 2021-08-31 | Discharge: 2021-08-31 | Disposition: A | Discharge status: Home / Self Care | Payer: Medicaid Other | Attending: Emergency Medicine | Admitting: Emergency Medicine

## 2021-08-31 DIAGNOSIS — T310 Burns involving less than 10% of body surface: Secondary | ICD-10-CM | POA: Insufficient documentation

## 2021-08-31 DIAGNOSIS — X102XXA Contact with fats and cooking oils, initial encounter: Secondary | ICD-10-CM | POA: Diagnosis not present

## 2021-08-31 DIAGNOSIS — T23261A Burn of second degree of back of right hand, initial encounter: Secondary | ICD-10-CM | POA: Insufficient documentation

## 2021-08-31 MED ORDER — OXYCODONE-ACETAMINOPHEN 5-325 MG PO TABS: 1.0000 | ORAL_TABLET | Freq: Four times a day (QID) | ORAL | 0 refills | Status: DC | PRN

## 2021-08-31 MED ORDER — OXYCODONE-ACETAMINOPHEN 5-325 MG PO TABS
1.0000 | ORAL_TABLET | Freq: Once | ORAL | Status: AC
  Administered 2021-08-31: 1 via ORAL
  Filled 2021-08-31: qty 1

## 2021-08-31 NOTE — Discharge Instructions (Addendum)
As we discussed, I have sent in additional narcotic pain medication for you to take for your hand.  Please take this as needed for severe pain and do not drive or operate heavy machinery after taking it.  I also recommend that you follow-up with the burn center for continued management.  Return if development of any new or worsening symptoms.

## 2021-08-31 NOTE — ED Triage Notes (Addendum)
Pt arrived via POV, c/o worsening pain to arm after burning her arm with oil yesterday, seen yesterday.

## 2021-08-31 NOTE — ED Provider Notes (Signed)
[facility] DEPT Provider Note   CSN: [id number] Arrival date & time: 08/31/21  1551     History  Chief Complaint  Patient presents with   Burn    Valerie Johnson is a 36 y.o. female.  Patient presents today with complaints of right hand burn. She states that same occurred yesterday when she tripped over her dog and accidentally spilled hot oil to the dorsum of the right hand.  She was initially seen at [facility] yesterday and was advised to apply bacitracin and given Motrin. Then presented to [facility] yesterday where she was given a prescription for Norco and discharged with information to call the burn center for follow-up. Her Tdap was updated on this visit.  She presents due to continued uncontrolled pain despite regularly taking ibuprofen and Norco. She denies any infectious symptoms.  The history is provided by the patient. No language interpreter was used.  Burn     Home Medications Prior to Admission medications   Medication Sig Start Date End Date Taking? Authorizing Provider  ARIPiprazole (ABILIFY) 2 MG tablet Take 2 mg by mouth daily. 04/12/20   [provider]  bacitracin ointment Apply 1 application topically 2 (two) times daily. 08/30/21   Couture, Cortni S, PA-C  cetirizine (ZYRTEC) 10 MG tablet Take 10 mg by mouth daily.     [provider]  escitalopram (LEXAPRO) 20 MG tablet Take 20 mg by mouth at bedtime. 04/12/20   [provider]  HYDROcodone-acetaminophen (NORCO/VICODIN) 5-325 MG tablet Take 1 tablet by mouth every 6 (six) hours as needed. 08/30/21   Couture, Cortni S, PA-C  ibuprofen (ADVIL) 800 MG tablet Take 1 tablet (800 mg total) by mouth every 8 (eight) hours as needed. 07/15/20   Donia Ast, PA  levonorgestrel (MIRENA) 20 MCG/24HR IUD 1 each by Intrauterine route once.    [provider]  omeprazole (PRILOSEC) 40 MG capsule Take 40 mg daily by mouth.    [provider]  SUMAtriptan (IMITREX) 100 MG tablet Take 1 tablet (100 mg total) by mouth every 2 (two) hours as needed for migraine. May repeat in 2 hours if headache persists or recurs. 05/27/21   Marcial Pacas, MD  tiZANidine (ZANAFLEX) 4 MG tablet Take 1 tablet (4 mg total) by mouth at bedtime. 07/16/21   Scot Jun, FNP  topiramate (TOPAMAX) 100 MG tablet Take 2 tablets (200 mg total) by mouth at bedtime. 05/27/21   Marcial Pacas, MD  traZODone (DESYREL) 100 MG tablet Take 100 mg by mouth at bedtime.     [provider]      Allergies    Patient has no known allergies.    Review of Systems   Review of Systems  Constitutional:  Negative for chills and fever.  Gastrointestinal:  Negative for diarrhea, nausea and vomiting.  Skin:  Positive for wound. Negative for pallor.  All other systems reviewed and are negative.  Physical Exam Updated Vital Signs BP 101/83 (BP Location: Left Arm)    Pulse 69    Temp (!) 97.3 F (36.3 C) (Oral)    Resp 17    SpO2 100%  Physical Exam Vitals and nursing note reviewed.  Constitutional:      General: She is not in acute distress.    Appearance: Normal appearance. She is normal weight. She is not ill-appearing, toxic-appearing or diaphoretic.  HENT:     Head: Normocephalic and atraumatic.  Eyes:     Extraocular  Movements: Extraocular movements intact.     Pupils: Pupils are equal, round, and reactive to light.  Cardiovascular:     Rate and Rhythm: Normal rate.  Pulmonary:     Effort: Pulmonary effort is normal. No respiratory distress.  Abdominal:     General: Abdomen is flat.  Musculoskeletal:     Cervical back: Normal range of motion.  Skin:    General: Skin is warm and dry.     Comments: 1% BSA partial-thickness burn to the dorsum of the right hand and extending over the distal right wrist but is not circumferential.  Radial and ulnar pulses are intact.  Cap refill less than 2 seconds noted to all fingers.  No blistering noted on exam.  Skin  is very tender to palpation without any signs of sloughing.  Full range of motion and strength noted to right wrist and fingers.  Neurological:     General: No focal deficit present.     Mental Status: She is alert.    ED Results / Procedures / Treatments   Labs (all labs ordered are listed, but only abnormal results are displayed) Labs Reviewed - No data to display  EKG None  Radiology No results found.  Procedures Procedures    Medications Ordered in ED Medications  oxyCODONE-acetaminophen (PERCOCET/ROXICET) 5-325 MG per tablet 1 tablet (has no administration in time range)    ED Course/ Medical Decision Making/ A&P                           Medical Decision Making Risk Prescription drug management.   36 year old female presents the emergency department today for evaluation of a burn that occurred yesterday after spilling oil on her right hand.  Patient does have about a 1% BSA partial-thickness burn to the dorsum of the right hand and slightly over the wrist but is not circumferential.  Radial and ulnar pulses are intact.  Brisk cap refill noted to all fingers.  No blistering noted on exam.  Skin is tender to palpation.  There is no sloughing of the skin.  Full range of motion and strength are currently intact.  Her Tdap was updated at her prior ED visit.  Wound care was performed and bacitracin was applied. She was given information for burn center which she plans to call tomorrow to schedule an appointment.  She is afebrile, nontoxic-appearing, and in no acute distress with reassuring vital signs.  No signs of infection at this time, no purulent drainage.  I do not feel that she requires admission or transfer to a burn center at this time.  Case discussed with Dr.Horton who is in agreement.  She did endorse that Percocet was more relieving for her pain that Norco, therefore will send in prescription for same.  She is agreeable to follow-up and we also discussed strict return  precautions.  She voiced understanding plan reasons to return.  All questions answered.  Patient discharged in stable condition.  Findings and plan of care discussed with supervising physician Dr. Dina Rich who is in agreement.     Final Clinical Impression(s) / ED Diagnoses Final diagnoses:  Partial thickness burn of back of right hand, initial encounter    Rx / DC Orders ED Discharge Orders          Ordered    oxyCODONE-acetaminophen (PERCOCET/ROXICET) 5-325 MG tablet  Every 6 hours PRN        08/31/21 1807  An After Visit Summary was printed and given to the patient.     Jorgina, Binning, PA-C 08/31/21 1808    Lorelle Gibbs, DO 09/01/21 2159

## 2021-08-31 NOTE — ED Notes (Signed)
Dc instructions and scripts reviewd with pt no questions or concerns at this time. Will follow up with burn center this week.

## 2021-09-02 ENCOUNTER — Emergency Department (HOSPITAL_COMMUNITY)
Admission: EM | Admit: 2021-09-02 | Discharge: 2021-09-02 | Disposition: A | Discharge status: Home / Self Care | Payer: Medicaid Other | Attending: Emergency Medicine | Admitting: Emergency Medicine

## 2021-09-02 ENCOUNTER — Other Ambulatory Visit: Payer: Self-pay

## 2021-09-02 ENCOUNTER — Encounter (HOSPITAL_COMMUNITY): Payer: Self-pay

## 2021-09-02 DIAGNOSIS — X19XXXD Contact with other heat and hot substances, subsequent encounter: Secondary | ICD-10-CM | POA: Insufficient documentation

## 2021-09-02 DIAGNOSIS — T23291D Burn of second degree of multiple sites of right wrist and hand, subsequent encounter: Secondary | ICD-10-CM | POA: Diagnosis not present

## 2021-09-02 DIAGNOSIS — Y9289 Other specified places as the place of occurrence of the external cause: Secondary | ICD-10-CM | POA: Insufficient documentation

## 2021-09-02 DIAGNOSIS — T23201D Burn of second degree of right hand, unspecified site, subsequent encounter: Secondary | ICD-10-CM

## 2021-09-02 DIAGNOSIS — T23091D Burn of unspecified degree of multiple sites of right wrist and hand, subsequent encounter: Secondary | ICD-10-CM | POA: Diagnosis present

## 2021-09-02 MED ORDER — HYDROMORPHONE HCL 1 MG/ML IJ SOLN
1.0000 mg | Freq: Once | INTRAMUSCULAR | Status: AC
  Administered 2021-09-02: 14:00:00 1 mg via INTRAMUSCULAR
  Filled 2021-09-02: qty 1

## 2021-09-02 MED ORDER — SILVER SULFADIAZINE 1 % EX CREA
TOPICAL_CREAM | Freq: Once | CUTANEOUS | Status: AC
  Filled 2021-09-02: qty 50

## 2021-09-02 MED ORDER — IBUPROFEN 800 MG PO TABS
800.0000 mg | ORAL_TABLET | Freq: Once | ORAL | Status: AC
  Administered 2021-09-02: 14:00:00 800 mg via ORAL
  Filled 2021-09-02: qty 1

## 2021-09-02 MED ORDER — HYDROMORPHONE HCL 1 MG/ML IJ SOLN
1.0000 mg | Freq: Once | INTRAMUSCULAR | Status: AC
  Administered 2021-09-02: 14:00:00 1 mg via INTRAMUSCULAR
  Filled 2021-09-02 (×2): qty 1

## 2021-09-02 NOTE — Discharge Instructions (Signed)
I have given you some pain medication while you are here to help get your pain under control so you can hopefully be more comfortable tonight.  Please keep taking the Percocet that you have at home, and you can also take up to 800 mg ibuprofen instead of 600 mg for pain and inflammation.  If also dressed the wound with Silvadene cream which sometimes is more pain relieving than simple bacitracin.  I sent the rest of that container home with you, you may use that when you change your bandage dressings twice a day.  Your burn appointment is tomorrow where they will be better able to manage your pain and discomfort long-term.  I hope you feel better soon!  Please return if you develop extreme numbness/tingling in your fingers, or fever that will not be managed with Motrin or Tylenol.

## 2021-09-02 NOTE — ED Notes (Signed)
Dressing applied to the right hand
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
## 2021-09-05 ENCOUNTER — Other Ambulatory Visit: Payer: Self-pay

## 2021-09-05 ENCOUNTER — Emergency Department (HOSPITAL_COMMUNITY)
Admission: EM | Admit: 2021-09-05 | Discharge: 2021-09-05 | Disposition: A | Discharge status: Home / Self Care | Payer: Medicaid Other | Attending: Emergency Medicine | Admitting: Emergency Medicine

## 2021-09-05 ENCOUNTER — Encounter (HOSPITAL_COMMUNITY): Payer: Self-pay | Admitting: Emergency Medicine

## 2021-09-05 DIAGNOSIS — T22211A Burn of second degree of right forearm, initial encounter: Secondary | ICD-10-CM | POA: Diagnosis present

## 2021-09-05 DIAGNOSIS — X102XXA Contact with fats and cooking oils, initial encounter: Secondary | ICD-10-CM | POA: Insufficient documentation

## 2021-09-05 DIAGNOSIS — N9489 Other specified conditions associated with female genital organs and menstrual cycle: Secondary | ICD-10-CM | POA: Insufficient documentation

## 2021-09-05 DIAGNOSIS — T23271A Burn of second degree of right wrist, initial encounter: Secondary | ICD-10-CM | POA: Insufficient documentation

## 2021-09-05 DIAGNOSIS — Z4800 Encounter for change or removal of nonsurgical wound dressing: Secondary | ICD-10-CM | POA: Insufficient documentation

## 2021-09-05 DIAGNOSIS — Z5189 Encounter for other specified aftercare: Secondary | ICD-10-CM

## 2021-09-05 LAB — BASIC METABOLIC PANEL
Anion gap: 7 (ref 5–15)
BUN: 11 mg/dL (ref 6–20)
CO2: 26 mmol/L (ref 22–32)
Calcium: 9 mg/dL (ref 8.9–10.3)
Chloride: 101 mmol/L (ref 98–111)
Creatinine, Ser: 0.79 mg/dL (ref 0.44–1.00)
GFR, Estimated: >60 mL/min (ref >60)
Glucose, Bld: 99 mg/dL (ref 70–99)
Potassium: 3.7 mmol/L (ref 3.5–5.1)
Sodium: 134 mmol/L — ABNORMAL LOW (ref 135–145)

## 2021-09-05 LAB — CBC WITH DIFFERENTIAL/PLATELET
Abs Immature Granulocytes: 0.02 10*3/uL (ref 0.00–0.07)
Basophils Absolute: 0.1 10*3/uL (ref 0.0–0.1)
Basophils Relative: 1 %
Eosinophils Absolute: 0.4 10*3/uL (ref 0.0–0.5)
Eosinophils Relative: 6 %
HCT: 37.1 % (ref 36.0–46.0)
Hemoglobin: 12.1 g/dL (ref 12.0–15.0)
Immature Granulocytes: 0 %
Lymphocytes Relative: 28 %
Lymphs Abs: 1.8 10*3/uL (ref 0.7–4.0)
MCH: 29.9 pg (ref 26.0–34.0)
MCHC: 32.6 g/dL (ref 30.0–36.0)
MCV: 91.6 fL (ref 80.0–100.0)
Monocytes Absolute: 0.3 10*3/uL (ref 0.1–1.0)
Monocytes Relative: 5 %
Neutro Abs: 3.9 10*3/uL (ref 1.7–7.7)
Neutrophils Relative %: 60 %
Platelets: 249 10*3/uL (ref 150–400)
RBC: 4.05 MIL/uL (ref 3.87–5.11)
RDW: 11.9 % (ref 11.5–15.5)
WBC: 6.5 10*3/uL (ref 4.0–10.5)
nRBC: 0 % (ref 0.0–0.2)

## 2021-09-05 LAB — I-STAT BETA HCG BLOOD, ED (MC, WL, AP ONLY): I-stat hCG, quantitative: <5 m[IU]/mL (ref <5)

## 2021-09-05 MED ORDER — OXYCODONE HCL 5 MG PO TABS
10.0000 mg | ORAL_TABLET | Freq: Once | ORAL | Status: AC
  Administered 2021-09-05: 10 mg via ORAL
  Filled 2021-09-05: qty 2

## 2021-09-05 MED ORDER — SILVER SULFADIAZINE 1 % EX CREA: TOPICAL_CREAM | Freq: Once | CUTANEOUS | Status: DC

## 2021-09-05 MED ORDER — SILVER SULFADIAZINE 1 % EX CREA
TOPICAL_CREAM | Freq: Once | CUTANEOUS | Status: AC
  Filled 2021-09-05: qty 50
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
## 2021-09-05 NOTE — Discharge Instructions (Addendum)
Your wound did not appear infected on her exam today.  Second-degree burns typically take 1 to 3 weeks to heal.  The underlying skin appears to be pink and healthy.  Please continue applying the Silvadene cream as you have been instructed with the nonstick bandages.  You can follow-up in the wound clinic for another recheck, or with your PCP.
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
## 2021-09-05 NOTE — ED Provider Triage Note (Signed)
Emergency Medicine Provider Triage Evaluation Note  Valerie Johnson , a 36 y.o. female  was evaluated in triage.  Pt complains of right hand pain.  She reports concern that her pain is increased, and when she changed her dressing today she was concerned for purulence.  She went to wound center yesterday, where the tried to take some skin off, goes back Monday. She had one episode of vomiting, no fevers.     Physical Exam  BP 103/79 (BP Location: Left Arm)    Pulse 82    Temp 97.6 F (36.4 C) (Oral)    Resp 15    SpO2 97%  Gen:   Awake, no distress  Resp:  Normal effort  MSK:   Moves extremities without difficulty  Other:  Normal speech.  Wound was partially undressed, there appears to be some fibrin, unable to view the entire wound due to pain with dressing removal attempt.  Medical Decision Making  Medically screening exam initiated at 4:16 PM.  Appropriate orders placed.  Lakindra Wible was informed that the remainder of the evaluation will be completed by another provider, this initial triage assessment does not replace that evaluation, and the importance of remaining in the ED until their evaluation is complete.     Lorin Glass, [place] 09/05/21 1628

## 2021-09-28 ENCOUNTER — Encounter (HOSPITAL_COMMUNITY): Payer: Self-pay

## 2021-09-28 ENCOUNTER — Emergency Department (HOSPITAL_COMMUNITY)
Admission: EM | Admit: 2021-09-28 | Discharge: 2021-09-28 | Disposition: A | Discharge status: Home / Self Care | Payer: BC Managed Care – PPO | Attending: Emergency Medicine | Admitting: Emergency Medicine

## 2021-09-28 ENCOUNTER — Other Ambulatory Visit: Payer: Self-pay

## 2021-09-28 DIAGNOSIS — T23361D Burn of third degree of back of right hand, subsequent encounter: Secondary | ICD-10-CM | POA: Diagnosis not present

## 2021-09-28 DIAGNOSIS — T2230XS Burn of third degree of shoulder and upper limb, except wrist and hand, unspecified site, sequela: Secondary | ICD-10-CM | POA: Insufficient documentation

## 2021-09-28 DIAGNOSIS — X102XXS Contact with fats and cooking oils, sequela: Secondary | ICD-10-CM | POA: Diagnosis not present

## 2021-09-28 DIAGNOSIS — X102XXD Contact with fats and cooking oils, subsequent encounter: Secondary | ICD-10-CM | POA: Insufficient documentation

## 2021-09-28 DIAGNOSIS — T23361S Burn of third degree of back of right hand, sequela: Secondary | ICD-10-CM

## 2021-09-28 MED ORDER — BACITRACIN ZINC 500 UNIT/GM EX OINT
TOPICAL_OINTMENT | Freq: Two times a day (BID) | CUTANEOUS | Status: DC
  Filled 2021-09-28: qty 0.9
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
## 2021-12-29 ENCOUNTER — Emergency Department (HOSPITAL_COMMUNITY): Payer: BC Managed Care – PPO

## 2021-12-29 ENCOUNTER — Observation Stay (HOSPITAL_COMMUNITY)
Admission: EM | Admit: 2021-12-29 | Discharge: 2021-12-31 | Disposition: A | Discharge status: Home / Self Care | Payer: BC Managed Care – PPO | Attending: Family Medicine | Admitting: Family Medicine

## 2021-12-29 ENCOUNTER — Other Ambulatory Visit: Payer: Self-pay

## 2021-12-29 ENCOUNTER — Encounter (HOSPITAL_COMMUNITY): Payer: Self-pay

## 2021-12-29 DIAGNOSIS — Z87891 Personal history of nicotine dependence: Secondary | ICD-10-CM

## 2021-12-29 DIAGNOSIS — F1490 Cocaine use, unspecified, uncomplicated: Secondary | ICD-10-CM | POA: Diagnosis present

## 2021-12-29 DIAGNOSIS — Z8571 Personal history of Hodgkin lymphoma: Secondary | ICD-10-CM

## 2021-12-29 DIAGNOSIS — Z79899 Other long term (current) drug therapy: Secondary | ICD-10-CM | POA: Insufficient documentation

## 2021-12-29 DIAGNOSIS — L03114 Cellulitis of left upper limb: Secondary | ICD-10-CM | POA: Diagnosis not present

## 2021-12-29 DIAGNOSIS — F1910 Other psychoactive substance abuse, uncomplicated: Secondary | ICD-10-CM | POA: Insufficient documentation

## 2021-12-29 DIAGNOSIS — L02414 Cutaneous abscess of left upper limb: Secondary | ICD-10-CM | POA: Diagnosis present

## 2021-12-29 DIAGNOSIS — L02413 Cutaneous abscess of right upper limb: Secondary | ICD-10-CM | POA: Diagnosis present

## 2021-12-29 DIAGNOSIS — L03113 Cellulitis of right upper limb: Secondary | ICD-10-CM | POA: Insufficient documentation

## 2021-12-29 DIAGNOSIS — F32A Depression, unspecified: Secondary | ICD-10-CM | POA: Diagnosis present

## 2021-12-29 DIAGNOSIS — A419 Sepsis, unspecified organism: Secondary | ICD-10-CM

## 2021-12-29 DIAGNOSIS — F419 Anxiety disorder, unspecified: Secondary | ICD-10-CM

## 2021-12-29 DIAGNOSIS — L0390 Cellulitis, unspecified: Secondary | ICD-10-CM | POA: Diagnosis present

## 2021-12-29 DIAGNOSIS — Z833 Family history of diabetes mellitus: Secondary | ICD-10-CM

## 2021-12-29 DIAGNOSIS — R2232 Localized swelling, mass and lump, left upper limb: Secondary | ICD-10-CM | POA: Diagnosis present

## 2021-12-29 DIAGNOSIS — F1990 Other psychoactive substance use, unspecified, uncomplicated: Secondary | ICD-10-CM

## 2021-12-29 DIAGNOSIS — D649 Anemia, unspecified: Secondary | ICD-10-CM

## 2021-12-29 DIAGNOSIS — K219 Gastro-esophageal reflux disease without esophagitis: Secondary | ICD-10-CM | POA: Diagnosis present

## 2021-12-29 DIAGNOSIS — Z9221 Personal history of antineoplastic chemotherapy: Secondary | ICD-10-CM

## 2021-12-29 LAB — COMPREHENSIVE METABOLIC PANEL
ALT: 16 U/L (ref 0–44)
AST: 23 U/L (ref 15–41)
Albumin: 3 g/dL — ABNORMAL LOW (ref 3.5–5.0)
Alkaline Phosphatase: 38 U/L (ref 38–126)
Anion gap: 6 (ref 5–15)
BUN: 8 mg/dL (ref 6–20)
CO2: 27 mmol/L (ref 22–32)
Calcium: 8.6 mg/dL — ABNORMAL LOW (ref 8.9–10.3)
Chloride: 108 mmol/L (ref 98–111)
Creatinine, Ser: 0.59 mg/dL (ref 0.44–1.00)
GFR, Estimated: >60 mL/min (ref >60)
Glucose, Bld: 110 mg/dL — ABNORMAL HIGH (ref 70–99)
Potassium: 3.5 mmol/L (ref 3.5–5.1)
Sodium: 141 mmol/L (ref 135–145)
Total Bilirubin: 0.7 mg/dL (ref 0.3–1.2)
Total Protein: 6.8 g/dL (ref 6.5–8.1)

## 2021-12-29 LAB — LACTIC ACID, PLASMA: Lactic Acid, Venous: 1.3 mmol/L (ref 0.5–1.9)

## 2021-12-29 LAB — CBC WITH DIFFERENTIAL/PLATELET
Abs Immature Granulocytes: 0.04 10*3/uL (ref 0.00–0.07)
Basophils Absolute: 0.1 10*3/uL (ref 0.0–0.1)
Basophils Relative: 1 %
Eosinophils Absolute: 0.1 10*3/uL (ref 0.0–0.5)
Eosinophils Relative: 1 %
HCT: 25.4 % — ABNORMAL LOW (ref 36.0–46.0)
Hemoglobin: 8.4 g/dL — ABNORMAL LOW (ref 12.0–15.0)
Immature Granulocytes: 0 %
Lymphocytes Relative: 10 %
Lymphs Abs: 1.2 10*3/uL (ref 0.7–4.0)
MCH: 29.3 pg (ref 26.0–34.0)
MCHC: 33.1 g/dL (ref 30.0–36.0)
MCV: 88.5 fL (ref 80.0–100.0)
Monocytes Absolute: 0.5 10*3/uL (ref 0.1–1.0)
Monocytes Relative: 4 %
Neutro Abs: 9.9 10*3/uL — ABNORMAL HIGH (ref 1.7–7.7)
Neutrophils Relative %: 84 %
Platelets: 379 10*3/uL (ref 150–400)
RBC: 2.87 MIL/uL — ABNORMAL LOW (ref 3.87–5.11)
RDW: 13.5 % (ref 11.5–15.5)
WBC: 11.8 10*3/uL — ABNORMAL HIGH (ref 4.0–10.5)
nRBC: 0 % (ref 0.0–0.2)

## 2021-12-29 MED ORDER — LACTATED RINGERS IV BOLUS
1000.0000 mL | Freq: Once | INTRAVENOUS | Status: AC
  Administered 2021-12-29: 1000 mL via INTRAVENOUS

## 2021-12-29 MED ORDER — LIDOCAINE-EPINEPHRINE (PF) 2 %-1:200000 IJ SOLN
20.0000 mL | Freq: Once | INTRAMUSCULAR | Status: AC
  Administered 2021-12-29: 20 mL
  Filled 2021-12-29: qty 20

## 2021-12-29 MED ORDER — VANCOMYCIN HCL IN DEXTROSE 1-5 GM/200ML-% IV SOLN
1000.0000 mg | Freq: Once | INTRAVENOUS | Status: DC
  Filled 2021-12-29: qty 200

## 2021-12-29 MED ORDER — LINEZOLID 600 MG/300ML IV SOLN
600.0000 mg | Freq: Two times a day (BID) | INTRAVENOUS | Status: DC
  Administered 2021-12-29: 600 mg via INTRAVENOUS
  Filled 2021-12-29: qty 300

## 2021-12-29 MED ORDER — TETANUS-DIPHTH-ACELL PERTUSSIS 5-2.5-18.5 LF-MCG/0.5 IM SUSY
0.5000 mL | PREFILLED_SYRINGE | Freq: Once | INTRAMUSCULAR | Status: DC
  Filled 2021-12-29: qty 0.5

## 2021-12-29 MED ORDER — DEXTROSE 5 % IV SOLN: 1500.0000 mg | Freq: Once | INTRAVENOUS | Status: DC

## 2021-12-29 MED ORDER — SODIUM CHLORIDE 0.9 % IV SOLN
2.0000 g | Freq: Once | INTRAVENOUS | Status: AC
  Administered 2021-12-30: 2 g via INTRAVENOUS
  Filled 2021-12-29: qty 12.5

## 2021-12-29 NOTE — ED Triage Notes (Signed)
Pt arrived via POV, c/o bilateral arm and leg swelling. States she has been using IV cocaine. Areas on arms have opened up and oozed liquid. Has been keeping arms covered.

## 2021-12-29 NOTE — ED Provider Notes (Incomplete)
[facility] DEPT Provider Note   CSN: [id number] Arrival date & time: 12/29/21  1756     History {Add pertinent medical, surgical, social history, OB history to HPI:1} Chief Complaint  Patient presents with   Leg Swelling   Arm Swelling    Valerie Johnson is a 36 y.o. female.  HPI     Home Medications Prior to Admission medications   Medication Sig Start Date End Date Taking? Authorizing Provider  ARIPiprazole (ABILIFY) 2 MG tablet Take 2 mg by mouth daily. 04/12/20   [provider]  bacitracin ointment Apply 1 application topically 2 (two) times daily. 08/30/21   Couture, Cortni S, PA-C  cetirizine (ZYRTEC) 10 MG tablet Take 10 mg by mouth daily.     [provider]  escitalopram (LEXAPRO) 20 MG tablet Take 20 mg by mouth at bedtime. 04/12/20   [provider]  ibuprofen (ADVIL) 800 MG tablet Take 1 tablet (800 mg total) by mouth every 8 (eight) hours as needed. 07/15/20   Donia Ast, PA  levonorgestrel (MIRENA) 20 MCG/24HR IUD 1 each by Intrauterine route once.    [provider]  omeprazole (PRILOSEC) 40 MG capsule Take 40 mg daily by mouth.    [provider]  oxyCODONE-acetaminophen (PERCOCET/ROXICET) 5-325 MG tablet Take 1 tablet by mouth every 6 (six) hours as needed for severe pain. 08/31/21   Smoot, Leary Roca, PA-C  SUMAtriptan (IMITREX) 100 MG tablet Take 1 tablet (100 mg total) by mouth every 2 (two) hours as needed for migraine. May repeat in 2 hours if headache persists or recurs. 05/27/21   Marcial Pacas, MD  tiZANidine (ZANAFLEX) 4 MG tablet Take 1 tablet (4 mg total) by mouth at bedtime. 07/16/21   Scot Jun, FNP  topiramate (TOPAMAX) 100 MG tablet Take 2 tablets (200 mg total) by mouth at bedtime. 05/27/21   Marcial Pacas, MD  traZODone (DESYREL) 100 MG tablet Take 100 mg by mouth at bedtime.     [provider]      Allergies    Acetaminophen    Review of Systems    Review of Systems  Physical Exam Updated Vital Signs BP (!) 154/70   Pulse 100   Temp 98.3 F (36.8 C) (Oral)   Resp 18   Ht '5\' 5"'$  (1.651 m)   Wt 64.9 kg   LMP 12/08/2021   SpO2 92%   BMI 23.80 kg/m  Physical Exam  ED Results / Procedures / Treatments   Labs (all labs ordered are listed, but only abnormal results are displayed) Labs Reviewed - No data to display  EKG None  Radiology No results found.  Procedures Procedures  {Document cardiac monitor, telemetry assessment procedure when appropriate:1}  Medications Ordered in ED Medications - No data to display  ED Course/ Medical Decision Making/ A&P                           Medical Decision Making  ***  {Document critical care time when appropriate:1} {Document review of labs and clinical decision tools ie heart score, Chads2Vasc2 etc:1}  {Document your independent review of radiology images, and any outside records:1} {Document your discussion with family members, caretakers, and with consultants:1} {Document social determinants of health affecting pt's care:1} {Document your decision making why or why not admission, treatments were needed:1} Final Clinical Impression(s) / ED Diagnoses Final diagnoses:  None    Rx / DC Orders ED  Discharge Orders     None

## 2021-12-30 ENCOUNTER — Inpatient Hospital Stay (HOSPITAL_COMMUNITY): Payer: BC Managed Care – PPO

## 2021-12-30 DIAGNOSIS — D649 Anemia, unspecified: Secondary | ICD-10-CM | POA: Diagnosis not present

## 2021-12-30 DIAGNOSIS — L0390 Cellulitis, unspecified: Secondary | ICD-10-CM | POA: Diagnosis present

## 2021-12-30 DIAGNOSIS — F419 Anxiety disorder, unspecified: Secondary | ICD-10-CM | POA: Diagnosis not present

## 2021-12-30 DIAGNOSIS — F1990 Other psychoactive substance use, unspecified, uncomplicated: Secondary | ICD-10-CM | POA: Diagnosis not present

## 2021-12-30 DIAGNOSIS — L02413 Cutaneous abscess of right upper limb: Secondary | ICD-10-CM | POA: Diagnosis present

## 2021-12-30 DIAGNOSIS — A419 Sepsis, unspecified organism: Secondary | ICD-10-CM

## 2021-12-30 DIAGNOSIS — Z87891 Personal history of nicotine dependence: Secondary | ICD-10-CM | POA: Diagnosis not present

## 2021-12-30 DIAGNOSIS — F32A Depression, unspecified: Secondary | ICD-10-CM | POA: Diagnosis present

## 2021-12-30 DIAGNOSIS — K219 Gastro-esophageal reflux disease without esophagitis: Secondary | ICD-10-CM | POA: Diagnosis present

## 2021-12-30 DIAGNOSIS — F1490 Cocaine use, unspecified, uncomplicated: Secondary | ICD-10-CM | POA: Diagnosis present

## 2021-12-30 DIAGNOSIS — L03114 Cellulitis of left upper limb: Secondary | ICD-10-CM | POA: Diagnosis not present

## 2021-12-30 DIAGNOSIS — F1910 Other psychoactive substance abuse, uncomplicated: Secondary | ICD-10-CM | POA: Diagnosis present

## 2021-12-30 DIAGNOSIS — Z833 Family history of diabetes mellitus: Secondary | ICD-10-CM | POA: Diagnosis not present

## 2021-12-30 DIAGNOSIS — L03113 Cellulitis of right upper limb: Secondary | ICD-10-CM | POA: Diagnosis present

## 2021-12-30 DIAGNOSIS — Z8571 Personal history of Hodgkin lymphoma: Secondary | ICD-10-CM | POA: Diagnosis not present

## 2021-12-30 DIAGNOSIS — Z79899 Other long term (current) drug therapy: Secondary | ICD-10-CM | POA: Diagnosis not present

## 2021-12-30 DIAGNOSIS — Z9221 Personal history of antineoplastic chemotherapy: Secondary | ICD-10-CM | POA: Diagnosis not present

## 2021-12-30 DIAGNOSIS — L02414 Cutaneous abscess of left upper limb: Secondary | ICD-10-CM | POA: Diagnosis present

## 2021-12-30 LAB — BASIC METABOLIC PANEL
Anion gap: 6 (ref 5–15)
BUN: 6 mg/dL (ref 6–20)
CO2: 26 mmol/L (ref 22–32)
Calcium: 8.5 mg/dL — ABNORMAL LOW (ref 8.9–10.3)
Chloride: 110 mmol/L (ref 98–111)
Creatinine, Ser: 0.61 mg/dL (ref 0.44–1.00)
GFR, Estimated: >60 mL/min (ref >60)
Glucose, Bld: 118 mg/dL — ABNORMAL HIGH (ref 70–99)
Potassium: 3.6 mmol/L (ref 3.5–5.1)
Sodium: 142 mmol/L (ref 135–145)

## 2021-12-30 LAB — CBC
HCT: 27.2 % — ABNORMAL LOW (ref 36.0–46.0)
Hemoglobin: 8.9 g/dL — ABNORMAL LOW (ref 12.0–15.0)
MCH: 29.2 pg (ref 26.0–34.0)
MCHC: 32.7 g/dL (ref 30.0–36.0)
MCV: 89.2 fL (ref 80.0–100.0)
Platelets: 404 10*3/uL — ABNORMAL HIGH (ref 150–400)
RBC: 3.05 MIL/uL — ABNORMAL LOW (ref 3.87–5.11)
RDW: 13.6 % (ref 11.5–15.5)
WBC: 10.3 10*3/uL (ref 4.0–10.5)
nRBC: 0 % (ref 0.0–0.2)

## 2021-12-30 LAB — IRON AND TIBC
Iron: 26 ug/dL — ABNORMAL LOW (ref 28–170)
Saturation Ratios: 13 % (ref 10.4–31.8)
TIBC: 199 ug/dL — ABNORMAL LOW (ref 250–450)
UIBC: 173 ug/dL

## 2021-12-30 LAB — LACTIC ACID, PLASMA: Lactic Acid, Venous: 1.3 mmol/L (ref 0.5–1.9)

## 2021-12-30 LAB — OCCULT BLOOD X 1 CARD TO LAB, STOOL: Fecal Occult Bld: POSITIVE — AB

## 2021-12-30 LAB — MRSA NEXT GEN BY PCR, NASAL: MRSA by PCR Next Gen: NOT DETECTED

## 2021-12-30 LAB — HIV ANTIBODY (ROUTINE TESTING W REFLEX): HIV Screen 4th Generation wRfx: NONREACTIVE

## 2021-12-30 LAB — FOLATE: Folate: 33.7 ng/mL (ref >5.9)

## 2021-12-30 LAB — VITAMIN B12: Vitamin B-12: 577 pg/mL (ref 180–914)

## 2021-12-30 MED ORDER — METHADONE HCL 10 MG PO TABS
10.0000 mg | ORAL_TABLET | Freq: Every day | ORAL | Status: DC
  Administered 2021-12-30 – 2021-12-31 (×2): 10 mg via ORAL
  Filled 2021-12-30 (×2): qty 1

## 2021-12-30 MED ORDER — ACETAMINOPHEN 325 MG PO TABS
650.0000 mg | ORAL_TABLET | Freq: Four times a day (QID) | ORAL | Status: DC | PRN
  Administered 2021-12-30 – 2021-12-31 (×3): 650 mg via ORAL
  Filled 2021-12-30 (×3): qty 2

## 2021-12-30 MED ORDER — VANCOMYCIN HCL 1250 MG/250ML IV SOLN
1250.0000 mg | Freq: Once | INTRAVENOUS | Status: AC
  Administered 2021-12-30: 1250 mg via INTRAVENOUS
  Filled 2021-12-30: qty 250

## 2021-12-30 MED ORDER — VANCOMYCIN HCL IN DEXTROSE 1-5 GM/200ML-% IV SOLN: 1000.0000 mg | Freq: Two times a day (BID) | INTRAVENOUS | Status: DC

## 2021-12-30 MED ORDER — ARIPIPRAZOLE 2 MG PO TABS
2.0000 mg | ORAL_TABLET | Freq: Every day | ORAL | Status: DC
Start: 2021-12-30 — End: 2021-12-31
  Administered 2021-12-30 – 2021-12-31 (×2): 2 mg via ORAL
  Filled 2021-12-30 (×2): qty 1

## 2021-12-30 MED ORDER — VANCOMYCIN HCL 750 MG/150ML IV SOLN
750.0000 mg | Freq: Two times a day (BID) | INTRAVENOUS | Status: DC
  Administered 2021-12-30 – 2021-12-31 (×2): 750 mg via INTRAVENOUS
  Filled 2021-12-30 (×4): qty 150

## 2021-12-30 MED ORDER — METHADONE HCL 10 MG PO TABS
10.0000 mg | ORAL_TABLET | Freq: Every day | ORAL | Status: DC
Start: 2021-12-30 — End: 2021-12-30

## 2021-12-30 MED ORDER — VANCOMYCIN HCL 1250 MG/250ML IV SOLN
1250.0000 mg | Freq: Once | INTRAVENOUS | Status: DC
  Filled 2021-12-30: qty 250

## 2021-12-30 MED ORDER — PANTOPRAZOLE SODIUM 40 MG PO TBEC
40.0000 mg | DELAYED_RELEASE_TABLET | Freq: Every day | ORAL | Status: DC
  Administered 2021-12-30 – 2021-12-31 (×2): 40 mg via ORAL
  Filled 2021-12-30 (×2): qty 1

## 2021-12-30 MED ORDER — TRAZODONE HCL 100 MG PO TABS
100.0000 mg | ORAL_TABLET | Freq: Every day | ORAL | Status: DC
  Administered 2021-12-30: 100 mg via ORAL
  Filled 2021-12-30: qty 1

## 2021-12-30 MED ORDER — SODIUM CHLORIDE 0.9 % IV SOLN
2.0000 g | Freq: Three times a day (TID) | INTRAVENOUS | Status: DC
  Administered 2021-12-30 – 2021-12-31 (×4): 2 g via INTRAVENOUS
  Filled 2021-12-30 (×5): qty 12.5

## 2021-12-30 MED ORDER — VANCOMYCIN HCL IN DEXTROSE 1-5 GM/200ML-% IV SOLN: 1000.0000 mg | Freq: Once | INTRAVENOUS | Status: DC

## 2021-12-30 MED ORDER — SODIUM CHLORIDE 0.9 % IV SOLN: INTRAVENOUS | Status: AC

## 2021-12-30 MED ORDER — POLYETHYLENE GLYCOL 3350 17 G PO PACK
17.0000 g | PACK | Freq: Every day | ORAL | Status: DC | PRN
  Administered 2021-12-30: 17 g via ORAL
  Filled 2021-12-30: qty 1

## 2021-12-30 MED ORDER — VANCOMYCIN HCL 750 MG/150ML IV SOLN
750.0000 mg | Freq: Two times a day (BID) | INTRAVENOUS | Status: DC
  Filled 2021-12-30: qty 150

## 2021-12-30 MED ORDER — VANCOMYCIN HCL 1250 MG/250ML IV SOLN: 1250.0000 mg | Freq: Once | INTRAVENOUS | Status: DC

## 2021-12-30 MED ORDER — ENSURE ENLIVE PO LIQD
237.0000 mL | Freq: Two times a day (BID) | ORAL | Status: DC
  Administered 2021-12-30 – 2021-12-31 (×2): 237 mL via ORAL

## 2021-12-30 MED ORDER — ESCITALOPRAM OXALATE 20 MG PO TABS
20.0000 mg | ORAL_TABLET | Freq: Every day | ORAL | Status: DC
  Administered 2021-12-30: 20 mg via ORAL
  Filled 2021-12-30: qty 1

## 2021-12-30 MED ORDER — LORATADINE 10 MG PO TABS
10.0000 mg | ORAL_TABLET | Freq: Every day | ORAL | Status: DC
  Administered 2021-12-30 – 2021-12-31 (×2): 10 mg via ORAL
  Filled 2021-12-30 (×2): qty 1
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
## 2021-12-30 NOTE — Assessment & Plan Note (Addendum)
Relapsed back into IV cocaine use after 5 years.  Previously able to quit through rehab program without medical intervention.  She has desired to quit and rejoined rehab program on discharge. -last use on afternoon of 5/22. Started '10mg'$  methadone daily in anticipation of withdrawal. Plan to gradually taper at this charge if she does not wish to use medical intervention to quit. -on COWS protocol -TOC consulted

## 2021-12-30 NOTE — TOC Initial Note (Signed)
Transition of Care [facility]) - Initial/Assessment Note    Patient Details  Name: Valerie Johnson MRN: [id number] Date of Birth: 10-20-1985  [facility] of [facility]) CM/SW Contact:    Leeroy Cha, RN Phone Number: 12/30/2021, 9:46 AM  Clinical Narrative:                 Patient request for rehab for coccaine use will give substance abuse resources.  Expected Discharge Plan: Home/Self Care Barriers to Discharge: Continued Medical Work up   Patient Goals and CMS Choice Patient states their goals for this hospitalization and ongoing recovery are:: to go home CMS Medicare.gov Compare Post Acute Care list provided to:: Patient Choice offered to / list presented to : Patient  Expected Discharge Plan and Services Expected Discharge Plan: Home/Self Care   Discharge Planning Services: CM Consult, Other - See comment (substance abuse resources) Post Acute Care Choice: NA Living arrangements for the past 2 months: Single Family Home                                      Prior Living Arrangements/Services Living arrangements for the past 2 months: Single Family Home Lives with:: Self Patient language and need for interpreter reviewed:: Yes Do you feel safe going back to the place where you live?: Yes            Criminal Activity/Legal Involvement Pertinent to Current Situation/Hospitalization: No - Comment as needed  Activities of Daily Living Home Assistive Devices/Equipment: None ADL Screening (condition at time of admission) Patient's cognitive ability adequate to safely complete daily activities?: Yes Is the patient deaf or have difficulty hearing?: No Does the patient have difficulty seeing, even when wearing glasses/contacts?: Yes Does the patient have difficulty concentrating, remembering, or making decisions?: Yes Patient able to express need for assistance with ADLs?: Yes Does the patient have difficulty dressing or bathing?: No Independently performs ADLs?:  Yes (appropriate for developmental age) Does the patient have difficulty walking or climbing stairs?: No Weakness of Legs: None Weakness of Arms/Hands: Both  Permission Sought/Granted                  Emotional Assessment Appearance:: Appears stated age     Orientation: : Oriented to Place, Oriented to Self, Oriented to  Time, Oriented to Situation Alcohol / Substance Use: Illicit Drugs, Alcohol Use Psych Involvement: No (comment)  Admission diagnosis:  Cellulitis of left upper extremity [L03.114] Cellulitis of right upper extremity [L03.113] Wound cellulitis [L03.90] Patient Active Problem List   Diagnosis Date Noted   Wound cellulitis 12/30/2021   Anxiety 12/30/2021   IVDU (intravenous drug user) 12/30/2021   Normocytic anemia 12/30/2021   Sepsis ([place]) 12/30/2021   Syncope and collapse 05/27/2021   Nonintractable headache 05/27/2021   PCP:  Maggie Schwalbe, PA-C Pharmacy:   [id number] [place], [id number]: [phone]: [phone] [id number] [place] [address] [place]: [phone]: [phone]   Social Determinants of Health (SDOH) Interventions    Readmission Risk Interventions     View : No data to display.

## 2021-12-30 NOTE — Hospital Course (Signed)
36 y.o. female with medical history significant of Hodgkin's lymphoma, IV drug abuse, anxiety, and GERD who presents with abscesses on upper extremity.   Patient reports that she has been off of cocaine for the past 5 years but recently just relapsed.  Last injection of cocaine on afternoon of 5/22.  For the past 2 days prior, has noticed several abscess on upper extremity with purulent drainage.  Reviewed chills.  No nausea, vomiting or diarrhea.  Stated she does not want to use again and has successfully quit in the past from rehab programs without any medical intervention.   In ED, noted to have purulent drainage with I/D attempted in ED. Pt was admitted for further work up

## 2021-12-30 NOTE — Progress Notes (Signed)
Pharmacy Antibiotic Note  Valerie Johnson is a 36 y.o. female admitted on 12/29/2021 with upper extremity abscess.  PMH significant for Hodgkin's lympoma, IVDA.  Pharmacy has been consulted for Vancomycin and Cefepime dosing.  In the ED Cefepime 2gm IV x 1 ordered and Zyvox '600mg'$  IV given x 1 dose.  Plan: Cefepime 2gm IV q8h Vancomycin '1250mg'$  IV x 1 loading dose followed by Vancomycin 1000 mg IV Q 12 hrs. Goal AUC 400-550.  Expected AUC: 550.8  SCr used: 0.8 (rounded up from 0.59) Follow renal function F/U culture results and sensitivities  Height: '5\' 5"'$  (165.1 cm) Weight: 64.9 kg (143 lb) IBW/kg (Calculated) : 57  Temp (24hrs), Avg:98.3 F (36.8 C), Min:98.3 F (36.8 C), Max:98.3 F (36.8 C)  Recent Labs  Lab 12/29/21 2212 12/29/21 2317  WBC 11.8*  --   CREATININE 0.59  --   LATICACIDVEN  --  1.3    Estimated Creatinine Clearance: 88.3 mL/min (by C-G formula based on SCr of 0.59 mg/dL).    No Active Allergies  Antimicrobials this admission: 5/22 Zyvox x 1 5/22 Cefepime >>   5/23 Vancomycin >>  Dose adjustments this admission:    Microbiology results: 5/22 BCx:      Thank you for allowing pharmacy to be a part of this patient's care.  Everette Rank, PharmD 12/30/2021 1:59 AM

## 2021-12-30 NOTE — Consult Note (Signed)
Reason for Consult:Right FA/hand infection Referring Physician: Marylu Lund Time called: 1222 Time at bedside: Valerie Johnson is an 36 y.o. female.  HPI: Valerie Johnson has been suffering from right FA wounds for the last 2-3d. They began out of the blue. She had some chills but no measured fevers or sweats. She reports significant improvement overnight and is reluctant for surgery. 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  Past Medical History:  Diagnosis Date   Acid reflux    Arthritis    Asthma    no issues in about 3 years   Depression    Hepatitis    Hep c no treament Was detected and when went to get treatment it was no longer detected   History of chemotherapy    Hodgkin lymphoma ([place])    Kidney infection     Past Surgical History:  Procedure Laterality Date   BREAST SURGERY     R lumpectomy   CESAREAN SECTION     CHOLECYSTECTOMY     KNEE ARTHROSCOPY WITH EXCISION PLICA Right 23/02/6282   Procedure: KNEE ARTHROSCOPY, loose body removal;  Surgeon: Vickey Huger, MD;  Location: [facility];  Service: Orthopedics;  Laterality: Right;   KNEE SURGERY     right knee knee arthroscopic. MPFL   MINOR HARDWARE REMOVAL Right 07/15/2020   Procedure: MINOR HARDWARE REMOVAL;  Surgeon: Vickey Huger, MD;  Location: [facility];  Service: Orthopedics;  Laterality: Right;   WISDOM TOOTH EXTRACTION      Family History  Problem Relation Age of Onset   Diabetes Mother    Healthy Father     Social History:  reports that she has quit smoking. Her smoking use included cigarettes. She has never used smokeless tobacco. She reports that she does not currently use alcohol. She reports that she does not currently use drugs.  Allergies: No Active Allergies  Medications: I have reviewed the patient's current medications.  Results for orders placed or performed during the hospital encounter of 12/29/21 (from the past 48 hour(s))  CBC with Differential     Status: Abnormal   Collection Time:  12/29/21 10:12 PM  Result Value Ref Range   WBC 11.8 (H) 4.0 - 10.5 K/uL   RBC 2.87 (L) 3.87 - 5.11 MIL/uL   Hemoglobin 8.4 (L) 12.0 - 15.0 g/dL   HCT 25.4 (L) 36.0 - 46.0 %   MCV 88.5 80.0 - 100.0 fL   MCH 29.3 26.0 - 34.0 pg   MCHC 33.1 30.0 - 36.0 g/dL   RDW 13.5 11.5 - 15.5 %   Platelets 379 150 - 400 K/uL   nRBC 0.0 0.0 - 0.2 %   Neutrophils Relative % 84 %   Neutro Abs 9.9 (H) 1.7 - 7.7 K/uL   Lymphocytes Relative 10 %   Lymphs Abs 1.2 0.7 - 4.0 K/uL   Monocytes Relative 4 %   Monocytes Absolute 0.5 0.1 - 1.0 K/uL   Eosinophils Relative 1 %   Eosinophils Absolute 0.1 0.0 - 0.5 K/uL   Basophils Relative 1 %   Basophils Absolute 0.1 0.0 - 0.1 K/uL   Immature Granulocytes 0 %   Abs Immature Granulocytes 0.04 0.00 - 0.07 K/uL    Comment: Performed at [facility], [place] [address], [place], [place] [address]  Comprehensive metabolic panel     Status: Abnormal   Collection Time: 12/29/21 10:12 PM  Result Value Ref Range   Sodium 141 135 - 145 mmol/L  Potassium 3.5 3.5 - 5.1 mmol/L   Chloride 108 98 - 111 mmol/L   CO2 27 22 - 32 mmol/L   Glucose, Bld 110 (H) 70 - 99 mg/dL    Comment: Glucose reference range applies only to samples taken after fasting for at least 8 hours.   BUN 8 6 - 20 mg/dL   Creatinine, Ser 0.59 0.44 - 1.00 mg/dL   Calcium 8.6 (L) 8.9 - 10.3 mg/dL   Total Protein 6.8 6.5 - 8.1 g/dL   Albumin 3.0 (L) 3.5 - 5.0 g/dL   AST 23 15 - 41 U/L   ALT 16 0 - 44 U/L   Alkaline Phosphatase 38 38 - 126 U/L   Total Bilirubin 0.7 0.3 - 1.2 mg/dL   GFR, Estimated >60 >60 mL/min    Comment: (NOTE) Calculated using the CKD-EPI Creatinine Equation (2021)    Anion gap 6 5 - 15    Comment: Performed at [facility], [place] [address], [place], [place] [address]  Blood culture (routine x 2)     Status: None (Preliminary result)   Collection Time: 12/29/21 10:12 PM   Specimen: BLOOD  Result Value Ref Range   Specimen Description       BLOOD SITE NOT SPECIFIED Performed at [facility] [place] [address] [place], [place] [address]    Special Requests      BOTTLES DRAWN AEROBIC AND ANAEROBIC Blood Culture results may not be optimal due to an excessive volume of blood received in culture bottles Performed at [place] [address] [place], [place] [address]    Culture      NO GROWTH < 12 HOURS Performed at [facility] [address], [place], [place] [address]    Report Status PENDING   Blood culture (routine x 2)     Status: None (Preliminary result)   Collection Time: 12/29/21 10:12 PM   Specimen: BLOOD  Result Value Ref Range   Specimen Description      BLOOD SITE NOT SPECIFIED Performed at [place] [address] [place], [place] [address]    Special Requests      BOTTLES DRAWN AEROBIC AND ANAEROBIC Blood Culture adequate volume Performed at [facility] [address] [place], [place] [address]    Culture      NO GROWTH < 12 HOURS Performed at [place] [address] [place], [place] [address]    Report Status PENDING   Lactic acid, plasma     Status: None   Collection Time: 12/29/21 11:17 PM  Result Value Ref Range   Lactic Acid, Venous 1.3 0.5 - 1.9 mmol/L    Comment: Performed at [facility], [place] [address], [place], [place] [address]  Lactic acid, plasma     Status: None   Collection Time: 12/30/21  3:54 AM  Result Value Ref Range   Lactic Acid, Venous 1.3 0.5 - 1.9 mmol/L    Comment: Performed at [facility], [place] [address] [place], [place] [address]  HIV Antibody (routine testing w rflx)     Status: None   Collection Time: 12/30/21  3:54 AM  Result Value Ref Range   HIV Screen 4th Generation wRfx Non Reactive Non Reactive    Comment: Performed at [facility], [place] [address], [place], [place] [address]  CBC     Status: Abnormal   Collection  Time: 12/30/21  3:54 AM  Result Value Ref Range   WBC 10.3 4.0 - 10.5 K/uL   RBC 3.05 (L) 3.87 - 5.11 MIL/uL   Hemoglobin 8.9 (L) 12.0 - 15.0 g/dL   HCT 27.2 (L) 36.0 - 46.0 %   MCV 89.2 80.0 - 100.0 fL   MCH 29.2 26.0 - 34.0 pg   MCHC 32.7 30.0 - 36.0 g/dL   RDW 13.6 11.5 - 15.5 %   Platelets 404 (H) 150 - 400 K/uL   nRBC 0.0 0.0 - 0.2 %    Comment: Performed at [facility], [place] [address] [place], [place] [address]  Basic metabolic panel     Status: Abnormal   Collection Time: 12/30/21  3:54 AM  Result Value Ref Range   Sodium 142 135 - 145 mmol/L   Potassium 3.6 3.5 - 5.1 mmol/L   Chloride 110 98 - 111 mmol/L   CO2 26 22 - 32 mmol/L   Glucose, Bld 118 (H) 70 - 99 mg/dL    Comment: Glucose reference range applies only to samples taken after fasting for at least 8 hours.   BUN 6 6 - 20 mg/dL   Creatinine, Ser 0.61 0.44 - 1.00 mg/dL   Calcium 8.5 (L) 8.9 - 10.3 mg/dL   GFR, Estimated >60 >60 mL/min    Comment: (NOTE) Calculated using the CKD-EPI Creatinine Equation (2021)    Anion gap 6 5 - 15    Comment: Performed at [facility], [place] [address], [place], [place] [address]  Vitamin B12     Status: None   Collection Time: 12/30/21  3:54 AM  Result Value Ref Range   Vitamin B-12 577 180 - 914 pg/mL    Comment: (NOTE) This assay is not validated for testing neonatal or myeloproliferative syndrome specimens for Vitamin B12 levels. Performed at [facility], [place] [address], [place], [place] [address]   Folate     Status: None   Collection Time: 12/30/21  3:54 AM  Result Value Ref Range   Folate 33.7 >5.9 ng/mL    Comment: RESULTS CONFIRMED BY MANUAL DILUTION Performed at [facility] [address] [place], [place] [address]   Iron and TIBC     Status: Abnormal   Collection Time: 12/30/21  3:54 AM  Result Value Ref Range   Iron 26 (L) 28 - 170 ug/dL   TIBC 199 (L) 250 - 450 ug/dL    Saturation Ratios 13 10.4 - 31.8 %   UIBC 173 ug/dL    Comment: Performed at [facility], [place] [address] [place], [place] [address]    DG Forearm Left  Result Date: 12/29/2021 CLINICAL DATA:  Necrotizing fasciitis EXAM: LEFT FOREARM - 2 VIEW COMPARISON:  None Available. FINDINGS: Normal alignment. No acute fracture or dislocation. There is mild diffuse subcutaneous edema involving the a proximal left forearm and visualized distal left upper extremity. No retained radiopaque foreign body. IMPRESSION: Soft tissue swelling involving the a proximal left forearm and visualized distal left upper extremity. No retained radiopaque foreign body. Electronically Signed   By: Fidela Salisbury M.D.   On: 12/29/2021 23:26   DG Forearm Right  Result Date: 12/29/2021 CLINICAL DATA:  Necrotizing fasciitis EXAM: RIGHT FOREARM - 2 VIEW COMPARISON:  None Available. FINDINGS: Normal alignment. No acute fracture or dislocation. There is moderate subcutaneous edema circumferentially surrounding the left elbow. There are punctate foci of gas seen radial to the distal right humerus which may  relate to subcutaneous injection or aggressive infection. No retained radiopaque foreign body. IMPRESSION: Soft tissue swelling surrounding the right elbow with punctate focus of subcutaneous gas possibly related to subcutaneous injection or aggressive infection. No retained radiopaque foreign body. Electronically Signed   By: Fidela Salisbury M.D.   On: 12/29/2021 23:27   [place] RT UPPER EXTREM LTD SOFT TISSUE NON VASCULAR  Result Date: 12/30/2021 CLINICAL DATA:  Right posterior elbow and forearm abscess. EXAM: ULTRASOUND RIGHT UPPER EXTREMITY LIMITED TECHNIQUE: Ultrasound examination of the upper extremity soft tissues was performed in the area of clinical concern. COMPARISON:  Right forearm x-rays from yesterday. FINDINGS: Focused ultrasound of the right posterior elbow and proximal forearm demonstrates a superficial  irregular complex heterogeneously hypoechoic area with tiny pockets of anechoic fluid and multiple punctate echogenic foci. The area measures 7.3 x 1.1 x 2.2 cm and demonstrates prominent surrounding increased vascularity. IMPRESSION: 1. 7.3 x 1.1 x 2.2 cm complex collection in the right posterior elbow and proximal forearm with prominent surrounding increased vascularity, suspicious for phlegmon. This does not appear drainable. However, the multiple punctate echogenic foci within the area are most consistent with subcutaneous gas, nonspecific in the setting of IV drug abuse. Correlate clinically for necrotizing fasciitis and consider noncontrast CT as needed. Electronically Signed   By: Titus Dubin M.D.   On: 12/30/2021 08:42    Review of Systems  Constitutional:  Negative for chills, diaphoresis and fever.  HENT:  Negative for ear discharge, ear pain, hearing loss and tinnitus.   Eyes:  Negative for photophobia and pain.  Respiratory:  Negative for cough and shortness of breath.   Cardiovascular:  Negative for chest pain.  Gastrointestinal:  Negative for abdominal pain, nausea and vomiting.  Genitourinary:  Negative for dysuria, flank pain, frequency and urgency.  Musculoskeletal:  Positive for arthralgias (Right FA/hand, improved). Negative for back pain, myalgias and neck pain.  Neurological:  Negative for dizziness and headaches.  Hematological:  Does not bruise/bleed easily.  Psychiatric/Behavioral:  The patient is not nervous/anxious.   Blood pressure 122/70, pulse 65, temperature 98 F (36.7 C), temperature source Oral, resp. rate 16, height '5\' 5"'$  (1.651 m), weight 64.9 kg, last menstrual period 12/08/2021, SpO2 100 %. Physical Exam Constitutional:      General: She is not in acute distress.    Appearance: She is well-developed. She is not diaphoretic.  HENT:     Head: Normocephalic and atraumatic.  Eyes:     General: No scleral icterus.       Right eye: No discharge.        Left  eye: No discharge.     Conjunctiva/sclera: Conjunctivae normal.  Cardiovascular:     Rate and Rhythm: Normal rate and regular rhythm.  Pulmonary:     Effort: Pulmonary effort is normal. No respiratory distress.  Musculoskeletal:     Cervical back: Normal range of motion.     Comments: Right shoulder, elbow, wrist, digits- Ulceration dorsal distal FA, lateral prox FA, no discharge or fluctuance, mild TTP, no instability, no blocks to motion  Sens  Ax/R/M/U intact  Mot   Ax/ R/ PIN/ M/ AIN/ U intact  Rad 2+  Skin:    General: Skin is warm and dry.  Neurological:     Mental Status: She is alert.  Psychiatric:        Mood and Affect: Mood normal.        Behavior: Behavior normal.    Assessment/Plan: Right FA/hand infection -- Given subjective  improvement overnight and my exam today do not feel pt needs surgical intervention at this point. Should she worsen or fail to improve may yet need I&D. Please let us know should this be the case.    Lisette Abu, PA-C Orthopedic Surgery [phone] 12/30/2021, 1:31 PM

## 2021-12-30 NOTE — Assessment & Plan Note (Addendum)
Presenting Hbg of 8.4 down from 12.1 from 6 months ago Fe 26, b12 577, folate 33.7 Hemodynamically stable Recheck CBC in AM

## 2021-12-30 NOTE — Progress Notes (Addendum)
  Progress Note   Patient: Valerie Johnson YKD:[id number] DOB: 1986-02-06 DOA: 12/29/2021     0 DOS: the patient was seen and examined on 12/30/2021   Brief hospital course: 36 y.o. female with medical history significant of Hodgkin's lymphoma, IV drug abuse, anxiety, and GERD who presents with abscesses on upper extremity.   Patient reports that she has been off of cocaine for the past 5 years but recently just relapsed.  Last injection of cocaine on afternoon of 5/22.  For the past 2 days prior, has noticed several abscess on upper extremity with purulent drainage.  Reviewed chills.  No nausea, vomiting or diarrhea.  Stated she does not want to use again and has successfully quit in the past from rehab programs without any medical intervention.   In ED, noted to have purulent drainage with I/D attempted in ED. Pt was admitted for further work up   Assessment and Plan: * Wound cellulitis -Continue broad spectrum abx per above -No need for surgery at this time per Hand Surgeon. Notify Hand Surgery if symptoms worsen  Sepsis ([place]) Secondary to abscess/cellulitis.  -presented with tachycardia and leukocytosis Multiple abscess on bilateral upper extremity.  She had I&D on 2 abscesses on both forearms in ED.  -Blood cx neg thus far -Fluctuant masses palpable on left posterior forearm adjacent to the elbow.  -Soft tissue US reviewed. Findings of 7cm non-drainable phlegmon -Appreciate input by Hand Surgeon. Swelling and pain improving. No evidence of surgery at this time -Cont broad spectrum abx for now  Normocytic anemia Presenting Hbg of 8.4 down from 12.1 from 6 months ago Fe 26, b12 577, folate 33.7 Hemodynamically stable Recheck CBC in AM   IVDU (intravenous drug user) Relapsed back into IV cocaine use after 5 years.  Previously able to quit through rehab program without medical intervention.  She has desired to quit and rejoined rehab program on discharge. -last use on afternoon of  5/22. Started '10mg'$  methadone daily in anticipation of withdrawal. Plan to gradually taper at this charge if she does not wish to use medical intervention to quit. -on COWS protocol -TOC consulted   Anxiety Continue Abilify, Lexapro and Trazodone         Subjective: States R arm swelling and pain is improving, still draining  Physical Exam: Vitals:   12/30/21 0123 12/30/21 0520 12/30/21 0853 12/30/21 1255  BP: 112/70 103/61 122/70 108/70  Pulse: 79 76 65 81  Resp: '20 20 16 16  '$ Temp: 98 F (36.7 C) 98.3 F (36.8 C) 98 F (36.7 C) 98.2 F (36.8 C)  TempSrc: Oral  Oral Oral  SpO2: 100% 100% 100% 99%  Weight:      Height:       General exam: Awake, laying in bed, in nad Respiratory system: Normal respiratory effort, no wheezing Cardiovascular system: regular rate, s1, s2 Gastrointestinal system: Soft, nondistended, positive BS Central nervous system: CN2-12 grossly intact, strength intact Extremities: Perfused, no clubbing Skin: R arm with palpable induration just distal to elbow, another area of induration just prox to W wrist, active oozing of small amount of sanguinous fluid Psychiatry: Mood normal // no visual hallucinations   Data Reviewed:  Labs reviewed: Cr 0.61, WBC 10.3, Hgb 8.9   Family Communication: Pt in room, family not at bedside  Disposition: Status is: Inpatient Remains inpatient appropriate because: Severity of illness  Planned Discharge Destination: Home    Author: Marylu Lund, MD 12/30/2021 3:49 PM  For on call review [email or web address].

## 2021-12-30 NOTE — Progress Notes (Incomplete)
Patient was seen. 

## 2021-12-30 NOTE — Assessment & Plan Note (Addendum)
Continue Abilify, Lexapro and Trazodone

## 2021-12-30 NOTE — Progress Notes (Signed)
Pharmacy Antibiotic Note  Valerie Johnson is a 36 y.o. female  with hx IVDU presented to the ED on 12/29/2021 with c/o bilateral arm pain and swelling.   She was found to have multiple abscesses and had I&D done in the ED on 12/29/21.  She's currently vancomycin and cefepime for infection.  Today, 12/30/2021: - day #1 abx - afeb, wbc down 10.3 - scr 0.61 (crcl~88) - all cultures have been negative thus far  Plan: - cefepime 2gm IV q8h -  vancomycin 1250 mg IV x1 then 750 mg IV q12h for est AUC 412  __________________________________________  Height: '5\' 5"'$  (165.1 cm) Weight: 64.9 kg (143 lb) IBW/kg (Calculated) : 57  Temp (24hrs), Avg:98.2 F (36.8 C), Min:98 F (36.7 C), Max:98.3 F (36.8 C)  Recent Labs  Lab 12/29/21 2212 12/29/21 2317 12/30/21 0354  WBC 11.8*  --  10.3  CREATININE 0.59  --  0.61  LATICACIDVEN  --  1.3 1.3    Estimated Creatinine Clearance: 88.3 mL/min (by C-G formula based on SCr of 0.61 mg/dL).    No Active Allergies   Thank you for allowing pharmacy to be a part of this patient's care.  Lynelle Doctor 12/30/2021 10:50 AM

## 2021-12-30 NOTE — Assessment & Plan Note (Addendum)
-  Continue broad spectrum abx per above -No need for surgery at this time per Hand Surgeon. Notify Hand Surgery if symptoms worsen

## 2021-12-30 NOTE — Assessment & Plan Note (Addendum)
Secondary to abscess/cellulitis.  -presented with tachycardia and leukocytosis Multiple abscess on bilateral upper extremity.  She had I&D on 2 abscesses on both forearms in ED.  -Blood cx neg thus far -Fluctuant masses palpable on left posterior forearm adjacent to the elbow.  -Soft tissue US reviewed. Findings of 7cm non-drainable phlegmon -Appreciate input by Hand Surgeon. Swelling and pain improving. No evidence of surgery at this time -Cont broad spectrum abx for now

## 2021-12-31 DIAGNOSIS — F1990 Other psychoactive substance use, unspecified, uncomplicated: Secondary | ICD-10-CM | POA: Diagnosis not present

## 2021-12-31 DIAGNOSIS — L03113 Cellulitis of right upper limb: Secondary | ICD-10-CM | POA: Diagnosis not present

## 2021-12-31 DIAGNOSIS — D649 Anemia, unspecified: Secondary | ICD-10-CM | POA: Diagnosis not present

## 2021-12-31 DIAGNOSIS — L0390 Cellulitis, unspecified: Secondary | ICD-10-CM | POA: Diagnosis not present

## 2021-12-31 DIAGNOSIS — L03114 Cellulitis of left upper limb: Secondary | ICD-10-CM | POA: Diagnosis not present

## 2021-12-31 MED ORDER — DOXYCYCLINE HYCLATE 100 MG PO CAPS: 100.0000 mg | ORAL_CAPSULE | Freq: Two times a day (BID) | ORAL | 0 refills | Status: AC

## 2021-12-31 MED ORDER — AMOXICILLIN-POT CLAVULANATE 875-125 MG PO TABS
1.0000 | ORAL_TABLET | Freq: Two times a day (BID) | ORAL | 0 refills | Status: AC
Start: 2021-12-31 — End: 2022-01-07

## 2021-12-31 MED ORDER — ACETAMINOPHEN 325 MG PO TABS: 650.0000 mg | ORAL_TABLET | Freq: Four times a day (QID) | ORAL | Status: AC | PRN

## 2021-12-31 NOTE — Progress Notes (Signed)
Patient vital signs stable.   RN removed patient IV, then reviewed discharge paperwork with patient who stated understanding. Resources for substance abuse were provided to patient as well. RN also provided printed copy of MD letter for the patient's return to work.  Patient was in possession of all belongings. RN accompanied patient to exit where patient's car was sitting in parking lot.

## 2021-12-31 NOTE — TOC Transition Note (Signed)
Transition of Care [facility]) - CM/SW Discharge Note   Patient Details  Name: Charnay Nazario MRN: [id number] Date of Birth: 12-Jan-1986  Transition of Care [facility]) CM/SW Contact:  Leeroy Cha, RN Phone Number: 12/31/2021, 2:53 PM   Clinical Narrative:    Patient dcd to home with no toc needs   Final next level of care: Home/Self Care Barriers to Discharge: Continued Medical Work up   Patient Goals and CMS Choice Patient states their goals for this hospitalization and ongoing recovery are:: to go home CMS Medicare.gov Compare Post Acute Care list provided to:: Patient Choice offered to / list presented to : Patient  Discharge Placement                       Discharge Plan and Services   Discharge Planning Services: CM Consult, Other - See comment (substance abuse resources) Post Acute Care Choice: NA                               Social Determinants of Health (SDOH) Interventions     Readmission Risk Interventions     View : No data to display.

## 2021-12-31 NOTE — Discharge Summary (Signed)
Physician Discharge Summary   Patient: Valerie Johnson MRN: [id number] DOB: April 17, 1986  Admit date:     12/29/2021  Discharge date: 12/31/21  Discharge Physician: Oswald Hillock   PCP: Maggie Schwalbe, PA-C   Recommendations at discharge:   Follow-up orthopedic surgery in 1 week Follow-up PCP in 2 weeks  Discharge Diagnoses: Principal Problem:   Wound cellulitis Active Problems:   Anxiety   IVDU (intravenous drug user)   Normocytic anemia   Sepsis ([place])  Resolved Problems:   * No resolved hospital problems. *  Hospital Course: 36 y.o. female with medical history significant of Hodgkin's lymphoma, IV drug abuse, anxiety, and GERD who presents with abscesses on upper extremity.   Patient reports that she has been off of cocaine for the past 5 years but recently just relapsed.  Last injection of cocaine on afternoon of 5/22.  For the past 2 days prior, has noticed several abscess on upper extremity with purulent drainage.  Reviewed chills.  No nausea, vomiting or diarrhea.  Stated she does not want to use again and has successfully quit in the past from rehab programs without any medical intervention.   In ED, noted to have purulent drainage with I/D attempted in ED. Pt was admitted for further work up   Assessment and Plan:  * Wound cellulitis -Continue broad spectrum abx per above -No need for surgery at this time per Hand Surgeon.  -We will discharge on Augmentin and doxycycline for 7 days -Follow-up hand surgeon 1 week  Sepsis ([place]) Secondary to abscess/cellulitis.  -presented with tachycardia and leukocytosis -Resolved Multiple abscess on bilateral upper extremity.  She had I&D on 2 abscesses on both forearms in ED.  -Blood cx neg thus far -Fluctuant masses palpable on left posterior forearm adjacent to the elbow.  -Soft tissue US reviewed. Findings of 7cm non-drainable phlegmon -Appreciate input by Hand Surgeon. Swelling and pain improving. No evidence of surgery  at this time -We will discharge on Augmentin and doxycycline for 1 week   Normocytic anemia Presenting Hbg of 8.4 down from 12.1 from 6 months ago -Hemoglobin improved to 8.9 Fe 26, b12 577, folate 33.7, saturation 13% -Recommend to take over-the-counter iron supplementation and follow-up with PCP for further evaluation Hemodynamically stable    IVDU (intravenous drug user) Relapsed back into IV cocaine use after 5 years.  Previously able to quit through rehab program without medical intervention.  She has desired to quit and rejoined rehab program on discharge. -last use on afternoon of 5/22.  -We will consult social work for outpatient rehab information    Anxiety Continue Abilify, Lexapro and Trazodone          Consultants:  Procedures performed:  Disposition: Home Diet recommendation:  Discharge Diet Orders (From admission, onward)     Start     Ordered   12/31/21 0000  Diet - low sodium heart healthy        12/31/21 1433           Regular diet DISCHARGE MEDICATION: Allergies as of 12/31/2021   No Active Allergies      Medication List     STOP taking these medications    ibuprofen 800 MG tablet Commonly known as: ADVIL   oxyCODONE-acetaminophen 5-325 MG tablet Commonly known as: PERCOCET/ROXICET       TAKE these medications    acetaminophen 325 MG tablet Commonly known as: TYLENOL Take 2 tablets (650 mg total) by mouth every 6 (six) hours as needed  for mild pain, headache or fever.   amoxicillin-clavulanate 875-125 MG tablet Commonly known as: AUGMENTIN Take 1 tablet by mouth 2 (two) times daily for 7 days.   ARIPiprazole 2 MG tablet Commonly known as: ABILIFY Take 2 mg by mouth daily.   bacitracin ointment Apply 1 application topically 2 (two) times daily.   cetirizine 10 MG tablet Commonly known as: ZYRTEC Take 10 mg by mouth daily.   doxycycline 100 MG capsule Commonly known as: VIBRAMYCIN Take 1 capsule (100 mg total) by  mouth 2 (two) times daily for 7 days.   escitalopram 20 MG tablet Commonly known as: LEXAPRO Take 20 mg by mouth at bedtime.   levonorgestrel 20 MCG/24HR IUD Commonly known as: MIRENA 1 each by Intrauterine route once.   omeprazole 40 MG capsule Commonly known as: PRILOSEC Take 40 mg daily by mouth.   SUMAtriptan 100 MG tablet Commonly known as: Imitrex Take 1 tablet (100 mg total) by mouth every 2 (two) hours as needed for migraine. May repeat in 2 hours if headache persists or recurs.   tiZANidine 4 MG tablet Commonly known as: Zanaflex Take 1 tablet (4 mg total) by mouth at bedtime.   topiramate 100 MG tablet Commonly known as: TOPAMAX Take 2 tablets (200 mg total) by mouth at bedtime.   traZODone 100 MG tablet Commonly known as: DESYREL Take 100 mg by mouth at bedtime.        Follow-up Information     Nodal, Alphonzo Dublin, PA-C Follow up in 2 week(s).   Specialty: Physician Assistant Why: Discussed further evaluation for anemia. Contact information: [place] [address] [phone]         Orene Desanctis, MD. Schedule an appointment as soon as possible for a visit in 1 week(s).   Specialty: Orthopedic Surgery Contact information: [address] [place] [address] [phone]                Discharge Exam: Danley Danker Weights   12/29/21 1806  Weight: 64.9 kg    Ext- mild swelling and induration of both upper extremities  Condition at discharge: good  The results of significant diagnostics from this hospitalization (including imaging, microbiology, ancillary and laboratory) are listed below for reference.   Imaging Studies: DG Forearm Left  Result Date: 12/29/2021 CLINICAL DATA:  Necrotizing fasciitis EXAM: LEFT FOREARM - 2 VIEW COMPARISON:  None Available. FINDINGS: Normal alignment. No acute fracture or dislocation. There is mild diffuse subcutaneous edema involving the a proximal left forearm and  visualized distal left upper extremity. No retained radiopaque foreign body. IMPRESSION: Soft tissue swelling involving the a proximal left forearm and visualized distal left upper extremity. No retained radiopaque foreign body. Electronically Signed   By: Fidela Salisbury M.D.   On: 12/29/2021 23:26   DG Forearm Right  Result Date: 12/29/2021 CLINICAL DATA:  Necrotizing fasciitis EXAM: RIGHT FOREARM - 2 VIEW COMPARISON:  None Available. FINDINGS: Normal alignment. No acute fracture or dislocation. There is moderate subcutaneous edema circumferentially surrounding the left elbow. There are punctate foci of gas seen radial to the distal right humerus which may relate to subcutaneous injection or aggressive infection. No retained radiopaque foreign body. IMPRESSION: Soft tissue swelling surrounding the right elbow with punctate focus of subcutaneous gas possibly related to subcutaneous injection or aggressive infection. No retained radiopaque foreign body. Electronically Signed   By: Fidela Salisbury M.D.   On: 12/29/2021 23:27   [place] RT UPPER EXTREM LTD SOFT  TISSUE NON VASCULAR  Result Date: 12/30/2021 CLINICAL DATA:  Right posterior elbow and forearm abscess. EXAM: ULTRASOUND RIGHT UPPER EXTREMITY LIMITED TECHNIQUE: Ultrasound examination of the upper extremity soft tissues was performed in the area of clinical concern. COMPARISON:  Right forearm x-rays from yesterday. FINDINGS: Focused ultrasound of the right posterior elbow and proximal forearm demonstrates a superficial irregular complex heterogeneously hypoechoic area with tiny pockets of anechoic fluid and multiple punctate echogenic foci. The area measures 7.3 x 1.1 x 2.2 cm and demonstrates prominent surrounding increased vascularity. IMPRESSION: 1. 7.3 x 1.1 x 2.2 cm complex collection in the right posterior elbow and proximal forearm with prominent surrounding increased vascularity, suspicious for phlegmon. This does not appear drainable. However, the  multiple punctate echogenic foci within the area are most consistent with subcutaneous gas, nonspecific in the setting of IV drug abuse. Correlate clinically for necrotizing fasciitis and consider noncontrast CT as needed. Electronically Signed   By: Titus Dubin M.D.   On: 12/30/2021 08:42    Microbiology: Results for orders placed or performed during the hospital encounter of 12/29/21  Blood culture (routine x 2)     Status: None (Preliminary result)   Collection Time: 12/29/21 10:12 PM   Specimen: BLOOD  Result Value Ref Range Status   Specimen Description   Final    BLOOD SITE NOT SPECIFIED Performed at [facility] [address], [place], [place] [address]    Special Requests   Final    BOTTLES DRAWN AEROBIC AND ANAEROBIC Blood Culture results may not be optimal due to an excessive volume of blood received in culture bottles Performed at [facility] [address], [place], [place] [address]    Culture   Final    NO GROWTH 2 DAYS Performed at [facility] [address], [place], [place] [address]    Report Status PENDING  Incomplete  Blood culture (routine x 2)     Status: None (Preliminary result)   Collection Time: 12/29/21 10:12 PM   Specimen: BLOOD  Result Value Ref Range Status   Specimen Description   Final    BLOOD SITE NOT SPECIFIED Performed at [facility] [address] [place], [place] [address]    Special Requests   Final    BOTTLES DRAWN AEROBIC AND ANAEROBIC Blood Culture adequate volume Performed at [facility] [address], [place], [place] [address]    Culture   Final    NO GROWTH 2 DAYS Performed at [facility] [address] [place], [place] [address]    Report Status PENDING  Incomplete  MRSA Next Gen by PCR, Nasal     Status: None   Collection Time: 12/30/21  8:09 PM   Specimen: Nasal Mucosa; Nasal Swab  Result Value Ref Range Status   MRSA by  PCR Next Gen NOT DETECTED NOT DETECTED Final    Comment: (NOTE) The GeneXpert MRSA Assay (FDA approved for NASAL specimens only), is one component of a comprehensive MRSA colonization surveillance program. It is not intended to diagnose MRSA infection nor to guide or monitor treatment for MRSA infections. Test performance is not FDA approved in patients less than 30 years old. Performed at [facility], [place] [address], [place], [place] [address]     Labs: CBC: Recent Labs  Lab 12/29/21 2212 12/30/21 0354  WBC 11.8* 10.3  NEUTROABS 9.9*  --   HGB 8.4* 8.9*  HCT 25.4* 27.2*  MCV 88.5 89.2  PLT 379 802*   Basic Metabolic Panel: Recent Labs  Lab 12/29/21 2212 12/30/21 0354  NA 141 142  K 3.5 3.6  CL 108 110  CO2 27 26  GLUCOSE 110* 118*  BUN 8 6  CREATININE 0.59 0.61  CALCIUM 8.6* 8.5*   Liver Function Tests: Recent Labs  Lab 12/29/21 2212  AST 23  ALT 16  ALKPHOS 38  BILITOT 0.7  PROT 6.8  ALBUMIN 3.0*   CBG: No results for input(s): GLUCAP in the last 168 hours.  Discharge time spent: greater than 30 minutes.  Signed: Oswald Hillock, MD [facility] 12/31/2021

## 2021-12-31 NOTE — Plan of Care (Signed)
  Problem: Education: Goal: Knowledge of disease or condition will improve Outcome: Adequate for Discharge Goal: Understanding of discharge needs will improve Outcome: Adequate for Discharge   Problem: Health Behavior/Discharge Planning: Goal: Ability to identify changes in lifestyle to reduce recurrence of condition will improve Outcome: Adequate for Discharge Goal: Identification of resources available to assist in meeting health care needs will improve Outcome: Adequate for Discharge   Problem: Physical Regulation: Goal: Complications related to the disease process, condition or treatment will be avoided or minimized Outcome: Adequate for Discharge   Problem: Safety: Goal: Ability to remain free from injury will improve Outcome: Adequate for Discharge

## 2021-12-31 NOTE — Discharge Instructions (Addendum)
Take over-the-counter iron supplement for iron deficiency anemia

## 2022-01-03 LAB — CULTURE, BLOOD (ROUTINE X 2)
Culture: NO GROWTH
Culture: NO GROWTH
Special Requests: ADEQUATE

## 2022-01-11 ENCOUNTER — Emergency Department (HOSPITAL_COMMUNITY)
Admission: EM | Admit: 2022-01-11 | Discharge: 2022-01-11 | Disposition: A | Discharge status: Home / Self Care | Payer: BC Managed Care – PPO | Attending: Emergency Medicine | Admitting: Emergency Medicine

## 2022-01-11 ENCOUNTER — Other Ambulatory Visit: Payer: Self-pay

## 2022-01-11 ENCOUNTER — Emergency Department (HOSPITAL_COMMUNITY): Payer: BC Managed Care – PPO

## 2022-01-11 ENCOUNTER — Encounter (HOSPITAL_COMMUNITY): Payer: Self-pay

## 2022-01-11 DIAGNOSIS — L03119 Cellulitis of unspecified part of limb: Secondary | ICD-10-CM

## 2022-01-11 DIAGNOSIS — L03114 Cellulitis of left upper limb: Secondary | ICD-10-CM | POA: Diagnosis present

## 2022-01-11 DIAGNOSIS — L03113 Cellulitis of right upper limb: Secondary | ICD-10-CM | POA: Diagnosis not present

## 2022-01-11 LAB — COMPREHENSIVE METABOLIC PANEL
ALT: 20 U/L (ref 0–44)
AST: 24 U/L (ref 15–41)
Albumin: 4.2 g/dL (ref 3.5–5.0)
Alkaline Phosphatase: 44 U/L (ref 38–126)
Anion gap: 10 (ref 5–15)
BUN: 12 mg/dL (ref 6–20)
CO2: 23 mmol/L (ref 22–32)
Calcium: 9 mg/dL (ref 8.9–10.3)
Chloride: 102 mmol/L (ref 98–111)
Creatinine, Ser: 0.56 mg/dL (ref 0.44–1.00)
GFR, Estimated: >60 mL/min (ref >60)
Glucose, Bld: 96 mg/dL (ref 70–99)
Potassium: 3.9 mmol/L (ref 3.5–5.1)
Sodium: 135 mmol/L (ref 135–145)
Total Bilirubin: 0.8 mg/dL (ref 0.3–1.2)
Total Protein: 8.4 g/dL — ABNORMAL HIGH (ref 6.5–8.1)

## 2022-01-11 LAB — CBC WITH DIFFERENTIAL/PLATELET
Abs Immature Granulocytes: 0.04 10*3/uL (ref 0.00–0.07)
Basophils Absolute: 0.1 10*3/uL (ref 0.0–0.1)
Basophils Relative: 1 %
Eosinophils Absolute: 0.1 10*3/uL (ref 0.0–0.5)
Eosinophils Relative: 1 %
HCT: 33.2 % — ABNORMAL LOW (ref 36.0–46.0)
Hemoglobin: 10.6 g/dL — ABNORMAL LOW (ref 12.0–15.0)
Immature Granulocytes: 0 %
Lymphocytes Relative: 13 %
Lymphs Abs: 1.2 10*3/uL (ref 0.7–4.0)
MCH: 27.6 pg (ref 26.0–34.0)
MCHC: 31.9 g/dL (ref 30.0–36.0)
MCV: 86.5 fL (ref 80.0–100.0)
Monocytes Absolute: 0.5 10*3/uL (ref 0.1–1.0)
Monocytes Relative: 5 %
Neutro Abs: 7.8 10*3/uL — ABNORMAL HIGH (ref 1.7–7.7)
Neutrophils Relative %: 80 %
Platelets: 382 10*3/uL (ref 150–400)
RBC: 3.84 MIL/uL — ABNORMAL LOW (ref 3.87–5.11)
RDW: 14.2 % (ref 11.5–15.5)
WBC: 9.6 10*3/uL (ref 4.0–10.5)
nRBC: 0 % (ref 0.0–0.2)

## 2022-01-11 LAB — LACTIC ACID, PLASMA: Lactic Acid, Venous: 1 mmol/L (ref 0.5–1.9)

## 2022-01-11 MED ORDER — ACETAMINOPHEN 325 MG PO TABS
650.0000 mg | ORAL_TABLET | Freq: Once | ORAL | Status: AC
  Administered 2022-01-11: 650 mg via ORAL
  Filled 2022-01-11: qty 2

## 2022-01-11 MED ORDER — LACTATED RINGERS IV BOLUS
1000.0000 mL | Freq: Once | INTRAVENOUS | Status: AC
Start: 2022-01-11 — End: 2022-01-11
  Administered 2022-01-11: 1000 mL via INTRAVENOUS

## 2022-01-11 MED ORDER — KETOROLAC TROMETHAMINE 15 MG/ML IJ SOLN
15.0000 mg | Freq: Once | INTRAMUSCULAR | Status: AC
  Administered 2022-01-11: 15 mg via INTRAVENOUS
  Filled 2022-01-11: qty 1

## 2022-01-11 MED ORDER — CLINDAMYCIN HCL 300 MG PO CAPS: 300.0000 mg | ORAL_CAPSULE | Freq: Three times a day (TID) | ORAL | 0 refills | Status: AC

## 2022-01-11 NOTE — Discharge Instructions (Addendum)
Your work-up today was reassuring.  Blood work did not show any signs of infection.  X-rays did not show any concerning findings.  Take Tylenol and ibuprofen as you need to for pain control.  I have sent antibiotics into the pharmacy to take as directed.  If you have any worsening symptoms despite taking antibiotics please return to the emergency room for evaluation.

## 2022-01-11 NOTE — ED Provider Notes (Signed)
[facility] DEPT Provider Note   CSN: [id number] Arrival date & time: 01/11/22  5003     History  No chief complaint on file.   Valerie Johnson is a 36 y.o. female.  36 year old female with past medical history of IVDU, lymphoma currently in remission presents today for evaluation of worsening wounds to bilateral upper extremities.  She has a history of IV drug use.  Recently discharged after admission and IV antibiotics.  Blood cultures from that admission have been negative to date.  Endorses chills.  No no fever.  States she has significant improvement in symptoms following discharge however on Friday she states she has had significant worsening.  She states she fell asleep drinking a beer when she woke up she noticed worsening swelling, and blood on her arm.  Denies IV drug use since discharge.   The history is provided by the patient. No language interpreter was used.      Home Medications Prior to Admission medications   Medication Sig Start Date End Date Taking? Authorizing Provider  acetaminophen (TYLENOL) 325 MG tablet Take 2 tablets (650 mg total) by mouth every 6 (six) hours as needed for mild pain, headache or fever. 12/31/21   Oswald Hillock, MD  ARIPiprazole (ABILIFY) 2 MG tablet Take 2 mg by mouth daily. 04/12/20   [provider]  bacitracin ointment Apply 1 application topically 2 (two) times daily. Patient not taking: Reported on 12/29/2021 08/30/21   Couture, Cortni S, PA-C  cetirizine (ZYRTEC) 10 MG tablet Take 10 mg by mouth daily.     [provider]  escitalopram (LEXAPRO) 20 MG tablet Take 20 mg by mouth at bedtime. 04/12/20   [provider]  levonorgestrel (MIRENA) 20 MCG/24HR IUD 1 each by Intrauterine route once.    [provider]  omeprazole (PRILOSEC) 40 MG capsule Take 40 mg daily by mouth.    [provider]  SUMAtriptan (IMITREX) 100 MG tablet Take 1 tablet (100 mg total) by mouth  every 2 (two) hours as needed for migraine. May repeat in 2 hours if headache persists or recurs. Patient not taking: Reported on 12/29/2021 05/27/21   Marcial Pacas, MD  tiZANidine (ZANAFLEX) 4 MG tablet Take 1 tablet (4 mg total) by mouth at bedtime. Patient not taking: Reported on 12/29/2021 07/16/21   Scot Jun, FNP  topiramate (TOPAMAX) 100 MG tablet Take 2 tablets (200 mg total) by mouth at bedtime. Patient not taking: Reported on 12/29/2021 05/27/21   Marcial Pacas, MD  traZODone (DESYREL) 100 MG tablet Take 100 mg by mouth at bedtime.     [provider]      Allergies    Patient has no known allergies.    Review of Systems   Review of Systems  Constitutional:  Negative for chills and fever.  Skin:  Positive for wound.  All other systems reviewed and are negative.  Physical Exam Updated Vital Signs BP 130/78 (BP Location: Left Arm)   Pulse (!) 104   Temp 98.4 F (36.9 C) (Oral)   Resp 20   Ht '5\' 5"'$  (1.651 m)   Wt 63.5 kg   SpO2 100%   BMI 23.30 kg/m  Physical Exam Vitals and nursing note reviewed.  Constitutional:      General: She is not in acute distress.    Appearance: Normal appearance. She is not ill-appearing.  HENT:     Head: Normocephalic and atraumatic.     Nose: Nose normal.  Eyes:     General: No scleral icterus.    Extraocular Movements: Extraocular movements intact.     Conjunctiva/sclera: Conjunctivae normal.  Cardiovascular:     Rate and Rhythm: Normal rate and regular rhythm.     Pulses: Normal pulses.     Heart sounds: Normal heart sounds.  Pulmonary:     Effort: Pulmonary effort is normal. No respiratory distress.     Breath sounds: Normal breath sounds. No wheezing or rales.  Abdominal:     General: There is no distension.     Tenderness: There is no abdominal tenderness.  Musculoskeletal:        General: Normal range of motion.     Cervical back: Normal range of motion.  Skin:    General: Skin is warm and dry.     Comments:  Multiple wounds noted to bilateral upper extremities.  Please see attached pictures for description.  No active drainage.  Surrounding erythema noted.  2+ radial pulse present.  Neurological:     General: No focal deficit present.     Mental Status: She is alert. Mental status is at baseline.    ED Results / Procedures / Treatments   Labs (all labs ordered are listed, but only abnormal results are displayed) Labs Reviewed  CBC WITH DIFFERENTIAL/PLATELET  COMPREHENSIVE METABOLIC PANEL  LACTIC ACID, PLASMA  LACTIC ACID, PLASMA    EKG None  Radiology No results found.  Procedures Procedures    Medications Ordered in ED Medications  lactated ringers bolus 1,000 mL (has no administration in time range)  ketorolac (TORADOL) 15 MG/ML injection 15 mg (has no administration in time range)  acetaminophen (TYLENOL) tablet 650 mg (has no administration in time range)    ED Course/ Medical Decision Making/ A&P                           Medical Decision Making Amount and/or Complexity of Data Reviewed Labs: ordered. Radiology: ordered.  Risk OTC drugs. Prescription drug management.   36 year old female with past medical history of lymphoma currently in remission, IV drug use presents today for evaluation of worsening wounds to bilateral upper extremities.  Exam with evidence of cellulitis.  No drainable abscess noted.  Recently admitted and received IV antibiotics.  She was discharged on p.o. antibiotics which she states she completed.  Symptoms started worsening on Friday.  Previously had x-ray done during the admission and was found to have some subcutaneous gas.  CBC without leukocytosis, mild anemia with hemoglobin 10.6 which is improved from her baseline.  CMP unremarkable.  Lactic acid normal.  Bilateral arm x-rays without underlying bony injury, or subcutaneous gas.  Exam concerning for cellulitis.  She denies using IV drugs since discharge.  Without suspicion for compartment  syndrome.  Work-up overall reassuring.  Without septic presentation.  Will provide with clindamycin.  Strict return precautions discussed with patient.  She voices understanding and is in agreement with plan.   Final Clinical Impression(s) / ED Diagnoses Final diagnoses:  Cellulitis of upper extremity, unspecified laterality    Rx / DC Orders ED Discharge Orders          Ordered    clindamycin (CLEOCIN) 300 MG capsule  3 times daily        01/11/22 1213              Evlyn Courier, PA-C 01/11/22 1400    Lennice Sites, DO 01/11/22 1432

## 2022-01-11 NOTE — ED Triage Notes (Signed)
Patient c/o bilateral arm pain and swelling. Patient was seen on 12/29/21 for the same. Patient started crying and states that she was drinking a beer and when she woke up she saw that her arms were covered in blood and had marks on her.

## 2022-02-02 ENCOUNTER — Other Ambulatory Visit: Payer: Self-pay | Admitting: Oncology

## 2022-02-02 DIAGNOSIS — C8118 Nodular sclerosis classical Hodgkin lymphoma, lymph nodes of multiple sites: Secondary | ICD-10-CM

## 2022-02-02 NOTE — Progress Notes (Deleted)
[facility]  [address] [place],  [place]  [address] [phone]  Clinic Day:  02/02/2022  Referring physician: Maggie Schwalbe, PA-C   HISTORY OF PRESENT ILLNESS:  The patient is a 36 y.o. female with history of Hodgkin lymphoma, who completed 6 cycles of ABVD chemotherapy in 2013.  Labs and physical exams since then have shown no sign of disease recurrence.     PHYSICAL EXAM:  Last menstrual period 12/11/2021. Wt Readings from Last 3 Encounters:  01/11/22 140 lb (63.5 kg)  12/29/21 143 lb (64.9 kg)  09/28/21 151 lb (68.5 kg)   There is no height or weight on file to calculate BMI. Performance status (ECOG): {CHL ONC [id number] Physical Exam  LABS:      Latest Ref Rng & Units 01/11/2022   10:46 AM 12/30/2021    3:54 AM 12/29/2021   10:12 PM  CBC  WBC 4.0 - 10.5 K/uL 9.6  10.3  11.8   Hemoglobin 12.0 - 15.0 g/dL 10.6  8.9  8.4   Hematocrit 36.0 - 46.0 % 33.2  27.2  25.4   Platelets 150 - 400 K/uL 382  404  379       Latest Ref Rng & Units 01/11/2022   10:46 AM 12/30/2021    3:54 AM 12/29/2021   10:12 PM  CMP  Glucose 70 - 99 mg/dL 96  118  110   BUN 6 - 20 mg/dL '12  6  8   '$ Creatinine 0.44 - 1.00 mg/dL 0.56  0.61  0.59   Sodium 135 - 145 mmol/L 135  142  141   Potassium 3.5 - 5.1 mmol/L 3.9  3.6  3.5   Chloride 98 - 111 mmol/L 102  110  108   CO2 22 - 32 mmol/L '23  26  27   '$ Calcium 8.9 - 10.3 mg/dL 9.0  8.5  8.6   Total Protein 6.5 - 8.1 g/dL 8.4   6.8   Total Bilirubin 0.3 - 1.2 mg/dL 0.8   0.7   Alkaline Phos 38 - 126 U/L 44   38   AST 15 - 41 U/L 24   23   ALT 0 - 44 U/L 20   16      No results found for: "CEA1", "CEA" / No results found for: "CEA1", "CEA" No results found for: "PSA1" No results found for: "BDZ329" No results found for: "CAN125"  No results found for: "TOTALPROTELP", "ALBUMINELP", "A1GS", "A2GS", "BETS", "BETA2SER", "GAMS", "MSPIKE", "SPEI" Lab Results  Component Value Date   TIBC 199 (L)  12/30/2021   IRONPCTSAT 13 12/30/2021   No results found for: "LDH"  No results found for: "AFPTUMOR", "TOTALPROTELP", "ALBUMINELP", "A1GS", "A2GS", "BETS", "BETA2SER", "GAMS", "MSPIKE", "SPEI", "LDH", "CEA1", "CEA", "PSA1", "IGASERUM", "IGGSERUM", "IGMSERUM", "THGAB", "THYROGLB"  Review Flowsheet       Latest Ref Rng & Units 12/30/2021  Oncology Labs  %SAT 10.4 - 31.8 % 13      STUDIES:  DG Forearm Left  Result Date: 01/11/2022 CLINICAL DATA:  Drug user.  Swelling. EXAM: LEFT FOREARM - 2 VIEW COMPARISON:  None Available. FINDINGS: The soft tissue swelling is most prominent distally. No soft tissue gas or foreign body. No bony abnormalities. IMPRESSION: Soft tissue swelling in the distal left forearm. No soft tissue gas or foreign body. Electronically Signed   By: Dorise Bullion III M.D.   On: 01/11/2022 10:56   DG Forearm Right  Result Date: 01/11/2022 CLINICAL DATA:  Swelling and pain.  IV drug user. EXAM: RIGHT FOREARM - 2 VIEW COMPARISON:  None Available. FINDINGS: The bones are intact. No effusion in the elbow. No soft tissue gas or foreign body identified. IMPRESSION: Negative. Electronically Signed   By: Dorise Bullion III M.D.   On: 01/11/2022 10:55      ASSESSMENT & PLAN:   Assessment/Plan:  A 36 y.o. female with *** .The patient understands all the plans discussed today and is in agreement with them.      Betsaida [place] Macarthur Critchley, MD

## 2022-02-03 ENCOUNTER — Ambulatory Visit: Payer: BC Managed Care – PPO | Admitting: Oncology

## 2022-02-03 ENCOUNTER — Inpatient Hospital Stay: Payer: BC Managed Care – PPO | Attending: Physician Assistant

## 2022-06-10 DEATH — deceased

## 2023-01-29 IMAGING — CR DG FOREARM 2V*R*
2 series · 2 of 2 positions shown · non-contrast
Comparison: None Available.

CLINICAL DATA: Swelling and pain.  IV drug user.

EXAM:
RIGHT FOREARM - 2 VIEW

[x forearm ap right]
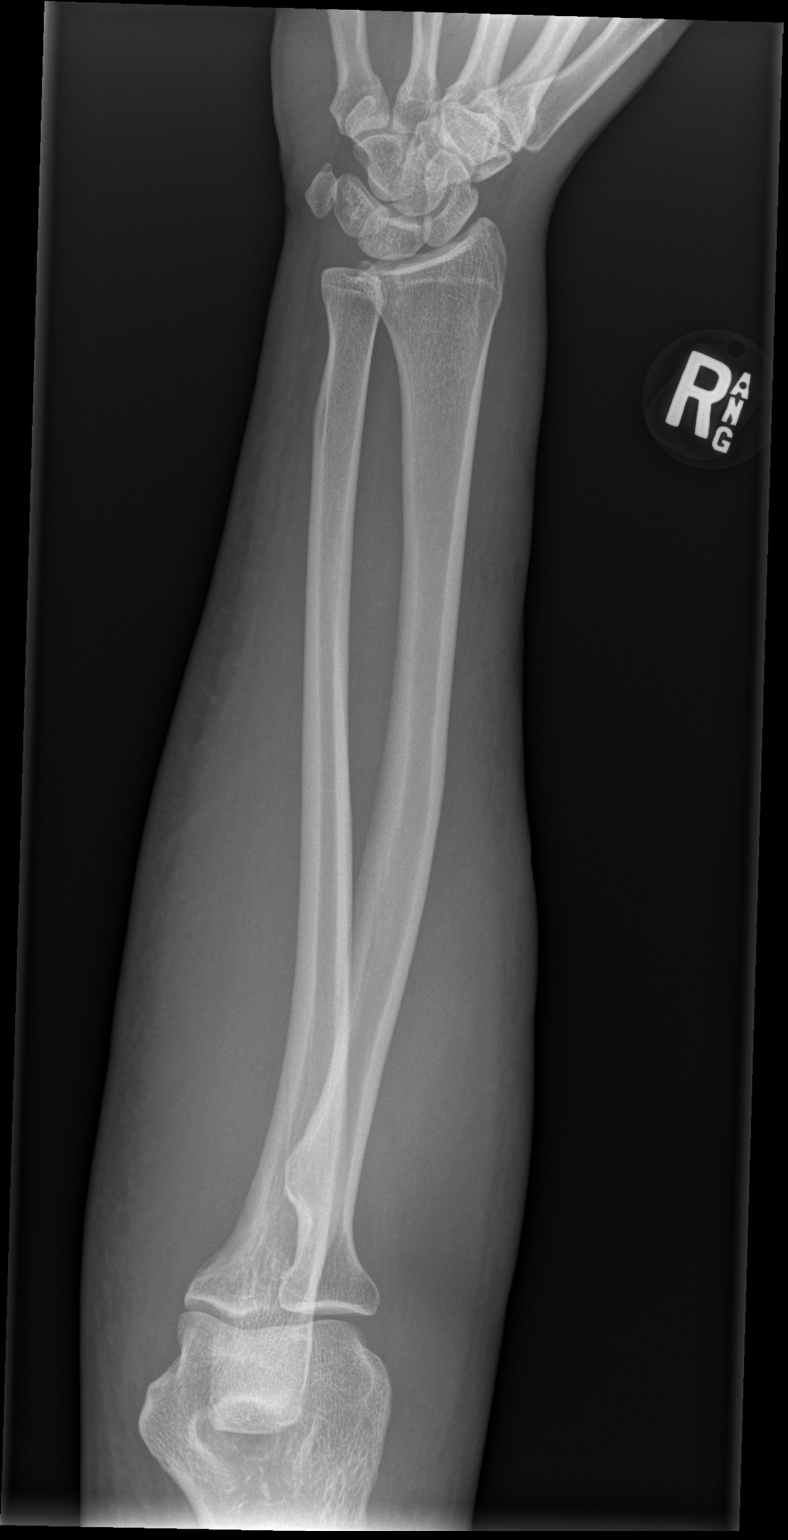

[x forearm lat right]
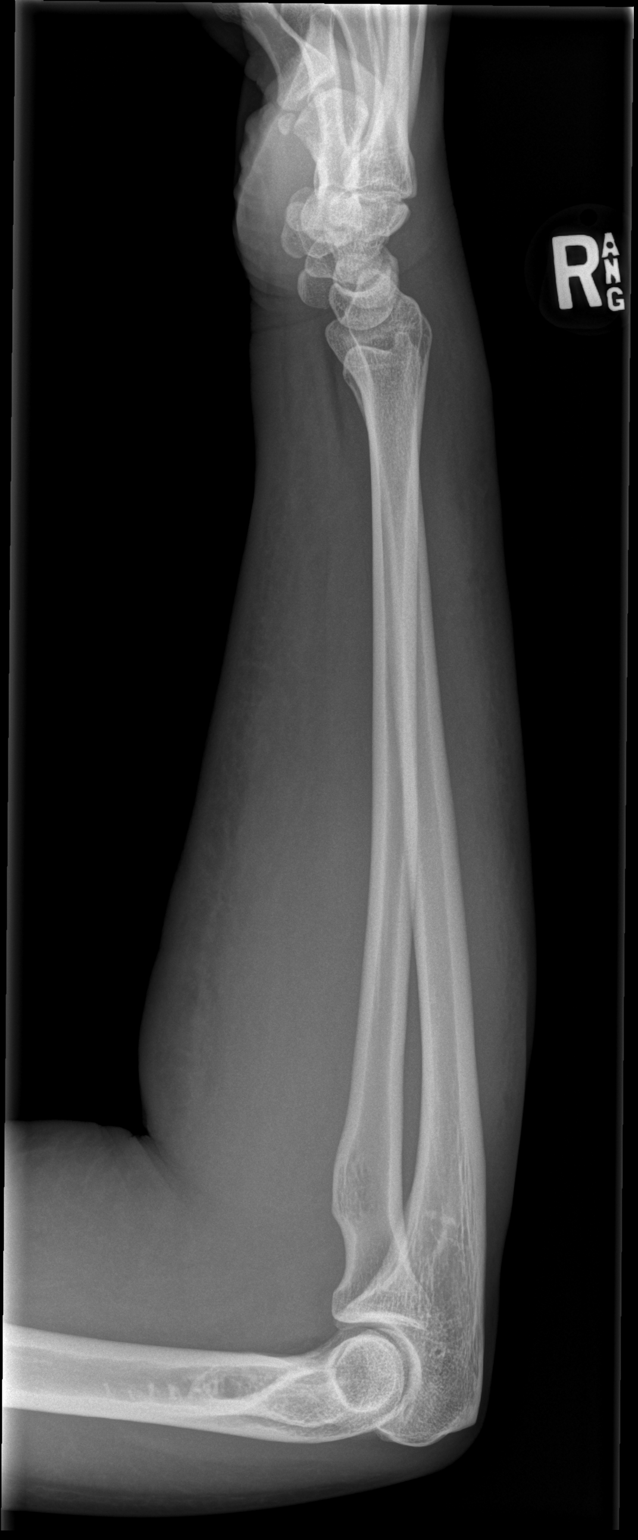

[2 of 2 positions shown; findings below may reference images not displayed]

FINDINGS: The bones are intact. No effusion in the elbow. No soft tissue gas
or foreign body identified.
IMPRESSION: Negative.

## 2023-01-29 IMAGING — CR DG FOREARM 2V*L*
2 series · 2 of 2 positions shown · non-contrast
Comparison: None Available.

CLINICAL DATA: Drug user.  Swelling.

EXAM:
LEFT FOREARM - 2 VIEW

[x forearm ap left]
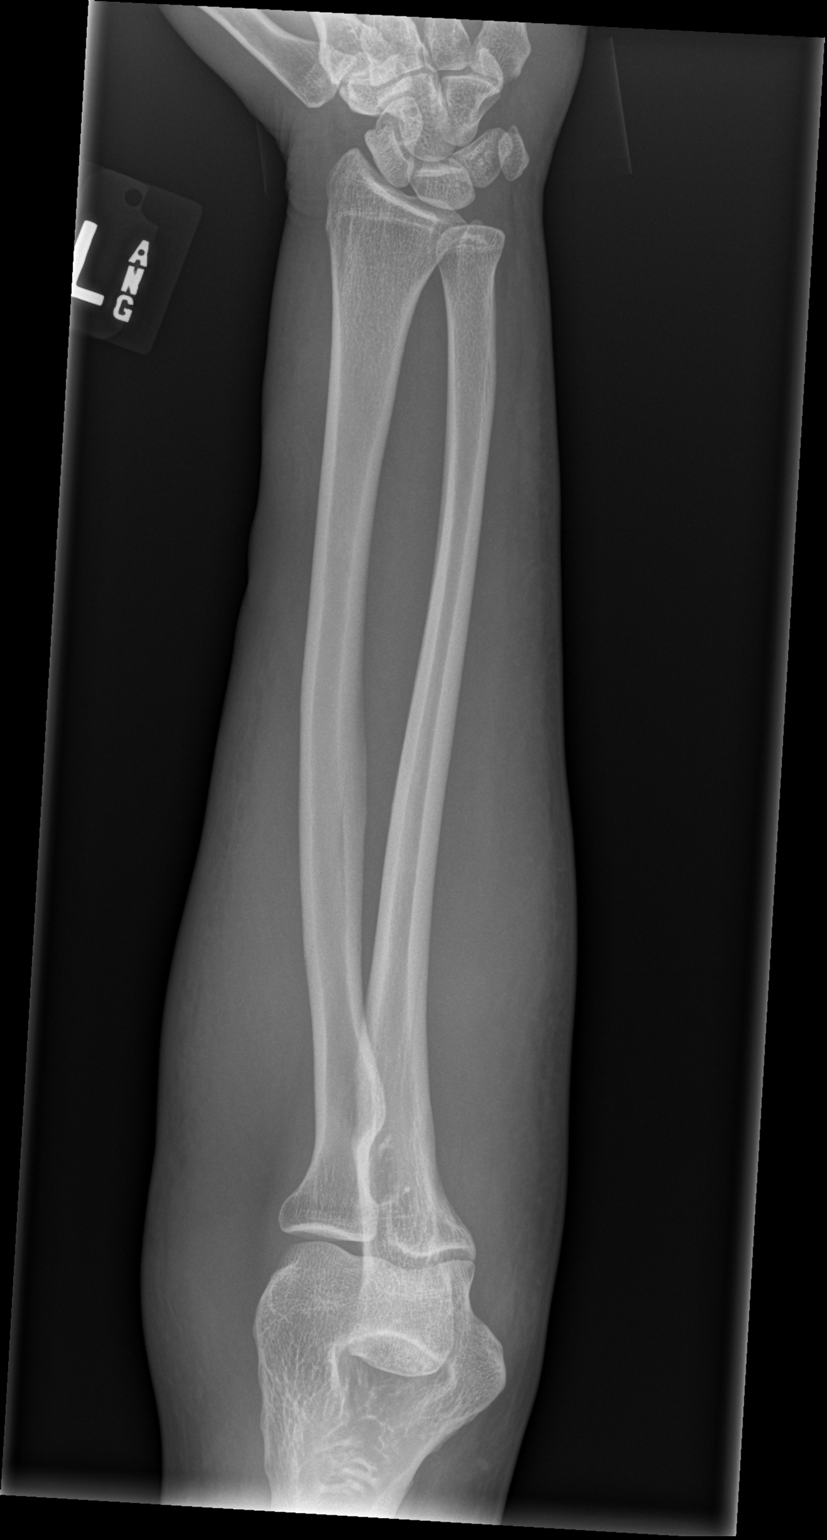

[x forearm lat left]
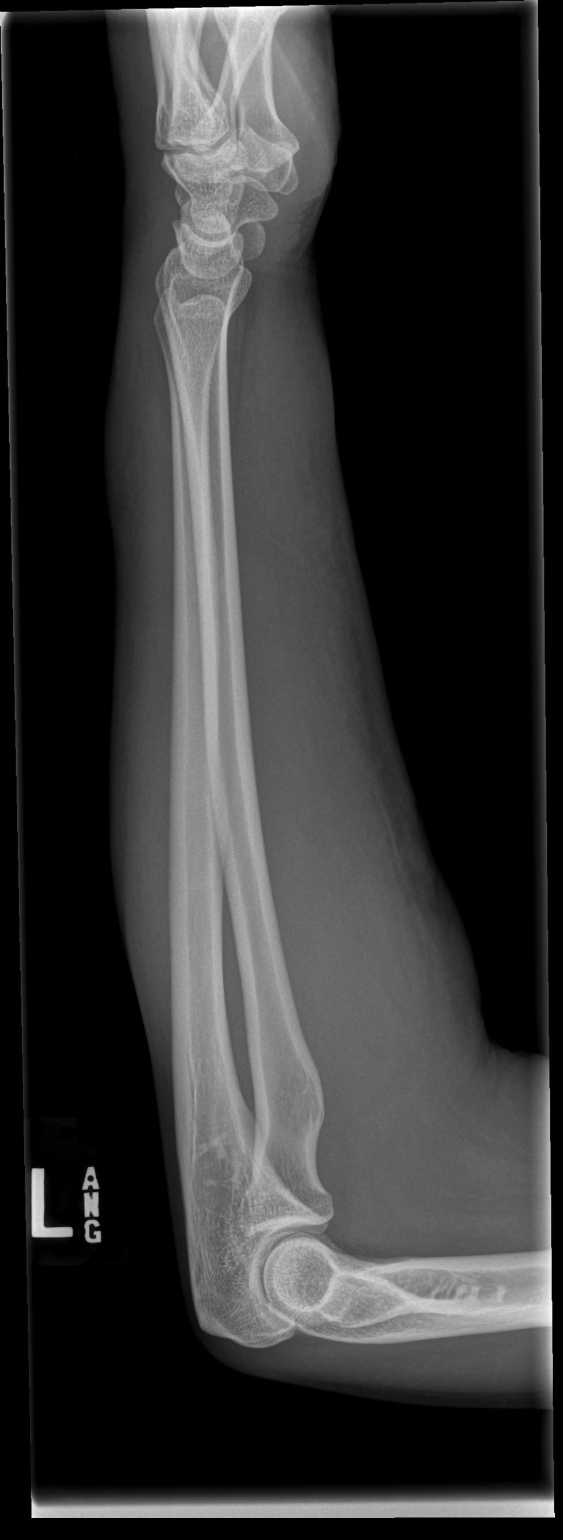

[2 of 2 positions shown; findings below may reference images not displayed]

FINDINGS: The soft tissue swelling is most prominent distally. No soft tissue
gas or foreign body. No bony abnormalities.
IMPRESSION: Soft tissue swelling in the distal left forearm. No soft tissue gas
or foreign body.
